# Patient Record
Sex: Female | Born: 1945 | Race: Black or African American | Hispanic: No | Marital: Married | Smoking: Never smoker
Health system: Southern US, Community
[De-identification: ages and names within clinical notes are randomized; demographics above are authoritative.]

## PROBLEM LIST (undated history)

## (undated) DIAGNOSIS — C50919 Malignant neoplasm of unspecified site of unspecified female breast: Secondary | ICD-10-CM

## (undated) DIAGNOSIS — Z9889 Other specified postprocedural states: Secondary | ICD-10-CM

## (undated) DIAGNOSIS — E785 Hyperlipidemia, unspecified: Secondary | ICD-10-CM

## (undated) DIAGNOSIS — R159 Full incontinence of feces: Secondary | ICD-10-CM

## (undated) DIAGNOSIS — I10 Essential (primary) hypertension: Secondary | ICD-10-CM

## (undated) DIAGNOSIS — K5730 Diverticulosis of large intestine without perforation or abscess without bleeding: Secondary | ICD-10-CM

## (undated) HISTORY — PX: PARTIAL HYSTERECTOMY: SHX80

## (undated) HISTORY — DX: Full incontinence of feces: R15.9

## (undated) HISTORY — DX: Other specified postprocedural states: Z98.890

## (undated) HISTORY — PX: OTHER SURGICAL HISTORY: SHX169

## (undated) HISTORY — DX: Hyperlipidemia, unspecified: E78.5

## (undated) HISTORY — DX: Diverticulosis of large intestine without perforation or abscess without bleeding: K57.30

## (undated) HISTORY — DX: Essential (primary) hypertension: I10

## (undated) HISTORY — DX: Malignant neoplasm of unspecified site of unspecified female breast: C50.919

---

## 2001-10-06 ENCOUNTER — Encounter: Payer: Self-pay | Admitting: Specialist

## 2001-10-06 ENCOUNTER — Ambulatory Visit (HOSPITAL_COMMUNITY): Admission: RE | Admit: 2001-10-06 | Discharge: 2001-10-06 | Payer: Self-pay | Admitting: Obstetrics and Gynecology

## 2002-11-09 ENCOUNTER — Encounter: Payer: Self-pay | Admitting: Specialist

## 2002-11-09 ENCOUNTER — Ambulatory Visit (HOSPITAL_COMMUNITY): Admission: RE | Admit: 2002-11-09 | Discharge: 2002-11-09 | Payer: Self-pay | Admitting: Specialist

## 2003-11-29 ENCOUNTER — Ambulatory Visit (HOSPITAL_COMMUNITY): Admission: RE | Admit: 2003-11-29 | Discharge: 2003-11-29 | Payer: Self-pay | Admitting: Specialist

## 2004-05-16 ENCOUNTER — Ambulatory Visit: Payer: Self-pay | Admitting: Internal Medicine

## 2004-05-16 ENCOUNTER — Ambulatory Visit (HOSPITAL_COMMUNITY): Admission: RE | Admit: 2004-05-16 | Discharge: 2004-05-16 | Payer: Self-pay | Admitting: Internal Medicine

## 2004-05-16 HISTORY — PX: COLONOSCOPY: SHX174

## 2004-06-16 DIAGNOSIS — C50919 Malignant neoplasm of unspecified site of unspecified female breast: Secondary | ICD-10-CM

## 2004-06-16 HISTORY — DX: Malignant neoplasm of unspecified site of unspecified female breast: C50.919

## 2005-01-21 ENCOUNTER — Ambulatory Visit (HOSPITAL_COMMUNITY): Admission: RE | Admit: 2005-01-21 | Discharge: 2005-01-21 | Payer: Self-pay | Admitting: Specialist

## 2005-02-12 ENCOUNTER — Encounter (INDEPENDENT_AMBULATORY_CARE_PROVIDER_SITE_OTHER): Payer: Self-pay | Admitting: Diagnostic Radiology

## 2005-02-12 ENCOUNTER — Ambulatory Visit (HOSPITAL_COMMUNITY): Admission: RE | Admit: 2005-02-12 | Discharge: 2005-02-12 | Payer: Self-pay | Admitting: Specialist

## 2005-02-19 ENCOUNTER — Ambulatory Visit (HOSPITAL_COMMUNITY): Admission: RE | Admit: 2005-02-19 | Discharge: 2005-02-19 | Payer: Self-pay | Admitting: Specialist

## 2005-03-10 ENCOUNTER — Encounter: Admission: RE | Admit: 2005-03-10 | Discharge: 2005-03-10 | Payer: Self-pay | Admitting: General Surgery

## 2005-03-10 ENCOUNTER — Ambulatory Visit (HOSPITAL_BASED_OUTPATIENT_CLINIC_OR_DEPARTMENT_OTHER): Admission: RE | Admit: 2005-03-10 | Discharge: 2005-03-10 | Payer: Self-pay | Admitting: General Surgery

## 2005-03-10 ENCOUNTER — Ambulatory Visit (HOSPITAL_COMMUNITY): Admission: RE | Admit: 2005-03-10 | Discharge: 2005-03-10 | Payer: Self-pay | Admitting: General Surgery

## 2005-03-10 ENCOUNTER — Encounter (INDEPENDENT_AMBULATORY_CARE_PROVIDER_SITE_OTHER): Payer: Self-pay | Admitting: *Deleted

## 2005-03-11 ENCOUNTER — Ambulatory Visit: Payer: Self-pay | Admitting: Oncology

## 2005-03-17 ENCOUNTER — Ambulatory Visit: Admission: RE | Admit: 2005-03-17 | Discharge: 2005-04-21 | Payer: Self-pay | Admitting: Radiation Oncology

## 2005-03-19 ENCOUNTER — Ambulatory Visit (HOSPITAL_COMMUNITY): Admission: RE | Admit: 2005-03-19 | Discharge: 2005-03-19 | Payer: Self-pay | Admitting: General Surgery

## 2005-03-19 ENCOUNTER — Ambulatory Visit (HOSPITAL_BASED_OUTPATIENT_CLINIC_OR_DEPARTMENT_OTHER): Admission: RE | Admit: 2005-03-19 | Discharge: 2005-03-19 | Payer: Self-pay | Admitting: General Surgery

## 2005-03-19 ENCOUNTER — Encounter (INDEPENDENT_AMBULATORY_CARE_PROVIDER_SITE_OTHER): Payer: Self-pay | Admitting: Specialist

## 2005-04-09 ENCOUNTER — Encounter (HOSPITAL_COMMUNITY): Admission: RE | Admit: 2005-04-09 | Discharge: 2005-05-09 | Payer: Self-pay | Admitting: Oncology

## 2005-04-09 ENCOUNTER — Ambulatory Visit (HOSPITAL_COMMUNITY): Payer: Self-pay | Admitting: Oncology

## 2005-04-09 ENCOUNTER — Encounter: Admission: RE | Admit: 2005-04-09 | Discharge: 2005-04-09 | Payer: Self-pay | Admitting: Oncology

## 2005-04-14 ENCOUNTER — Ambulatory Visit (HOSPITAL_BASED_OUTPATIENT_CLINIC_OR_DEPARTMENT_OTHER): Admission: RE | Admit: 2005-04-14 | Discharge: 2005-04-14 | Payer: Self-pay | Admitting: General Surgery

## 2005-04-14 ENCOUNTER — Ambulatory Visit (HOSPITAL_COMMUNITY): Admission: RE | Admit: 2005-04-14 | Discharge: 2005-04-14 | Payer: Self-pay | Admitting: General Surgery

## 2005-05-19 ENCOUNTER — Encounter (HOSPITAL_COMMUNITY): Admission: RE | Admit: 2005-05-19 | Discharge: 2005-05-28 | Payer: Self-pay | Admitting: Oncology

## 2005-05-19 ENCOUNTER — Encounter: Admission: RE | Admit: 2005-05-19 | Discharge: 2005-05-28 | Payer: Self-pay | Admitting: Oncology

## 2005-05-28 ENCOUNTER — Ambulatory Visit (HOSPITAL_COMMUNITY): Payer: Self-pay | Admitting: Oncology

## 2005-06-17 ENCOUNTER — Encounter (HOSPITAL_COMMUNITY): Admission: RE | Admit: 2005-06-17 | Discharge: 2005-07-17 | Payer: Self-pay | Admitting: Oncology

## 2005-06-17 ENCOUNTER — Encounter: Admission: RE | Admit: 2005-06-17 | Discharge: 2005-06-17 | Payer: Self-pay | Admitting: Oncology

## 2005-07-24 ENCOUNTER — Ambulatory Visit (HOSPITAL_COMMUNITY): Payer: Self-pay | Admitting: Oncology

## 2005-07-24 ENCOUNTER — Encounter (HOSPITAL_COMMUNITY): Admission: RE | Admit: 2005-07-24 | Discharge: 2005-08-23 | Payer: Self-pay | Admitting: Oncology

## 2005-07-24 ENCOUNTER — Encounter: Admission: RE | Admit: 2005-07-24 | Discharge: 2005-07-24 | Payer: Self-pay | Admitting: Oncology

## 2005-08-25 ENCOUNTER — Encounter: Admission: RE | Admit: 2005-08-25 | Discharge: 2005-08-25 | Payer: Self-pay | Admitting: Oncology

## 2005-09-09 ENCOUNTER — Encounter: Admission: RE | Admit: 2005-09-09 | Discharge: 2005-09-09 | Payer: Self-pay | Admitting: Radiation Oncology

## 2005-09-09 ENCOUNTER — Ambulatory Visit: Admission: RE | Admit: 2005-09-09 | Discharge: 2005-12-08 | Payer: Self-pay | Admitting: Radiation Oncology

## 2005-09-18 ENCOUNTER — Ambulatory Visit (HOSPITAL_COMMUNITY): Payer: Self-pay | Admitting: Oncology

## 2005-10-02 ENCOUNTER — Encounter: Admission: RE | Admit: 2005-10-02 | Discharge: 2005-10-02 | Payer: Self-pay | Admitting: Oncology

## 2005-10-02 ENCOUNTER — Encounter (HOSPITAL_COMMUNITY): Admission: RE | Admit: 2005-10-02 | Discharge: 2005-11-01 | Payer: Self-pay | Admitting: Oncology

## 2005-11-06 ENCOUNTER — Encounter: Admission: RE | Admit: 2005-11-06 | Discharge: 2005-11-06 | Payer: Self-pay | Admitting: Oncology

## 2005-11-06 ENCOUNTER — Encounter (HOSPITAL_COMMUNITY): Admission: RE | Admit: 2005-11-06 | Discharge: 2005-12-06 | Payer: Self-pay | Admitting: Oncology

## 2005-12-25 ENCOUNTER — Ambulatory Visit (HOSPITAL_COMMUNITY): Payer: Self-pay | Admitting: Oncology

## 2006-02-04 ENCOUNTER — Ambulatory Visit (HOSPITAL_COMMUNITY): Admission: RE | Admit: 2006-02-04 | Discharge: 2006-02-04 | Payer: Self-pay | Admitting: Family Medicine

## 2006-02-05 ENCOUNTER — Encounter: Admission: RE | Admit: 2006-02-05 | Discharge: 2006-02-05 | Payer: Self-pay | Admitting: Oncology

## 2006-02-05 ENCOUNTER — Encounter (HOSPITAL_COMMUNITY): Admission: RE | Admit: 2006-02-05 | Discharge: 2006-03-07 | Payer: Self-pay | Admitting: Oncology

## 2006-03-02 ENCOUNTER — Ambulatory Visit (HOSPITAL_COMMUNITY): Payer: Self-pay | Admitting: Oncology

## 2006-03-19 ENCOUNTER — Encounter: Admission: RE | Admit: 2006-03-19 | Discharge: 2006-03-19 | Payer: Self-pay | Admitting: Oncology

## 2006-04-03 ENCOUNTER — Ambulatory Visit (HOSPITAL_BASED_OUTPATIENT_CLINIC_OR_DEPARTMENT_OTHER): Admission: RE | Admit: 2006-04-03 | Discharge: 2006-04-03 | Payer: Self-pay | Admitting: General Surgery

## 2006-07-08 ENCOUNTER — Ambulatory Visit (HOSPITAL_COMMUNITY): Admission: RE | Admit: 2006-07-08 | Discharge: 2006-07-08 | Payer: Self-pay | Admitting: Family Medicine

## 2006-08-28 ENCOUNTER — Ambulatory Visit (HOSPITAL_COMMUNITY): Payer: Self-pay | Admitting: Oncology

## 2006-08-28 ENCOUNTER — Encounter (HOSPITAL_COMMUNITY): Admission: RE | Admit: 2006-08-28 | Discharge: 2006-09-27 | Payer: Self-pay | Admitting: Oncology

## 2007-02-17 ENCOUNTER — Encounter (HOSPITAL_COMMUNITY): Admission: RE | Admit: 2007-02-17 | Discharge: 2007-03-16 | Payer: Self-pay | Admitting: Oncology

## 2007-02-17 ENCOUNTER — Ambulatory Visit (HOSPITAL_COMMUNITY): Payer: Self-pay | Admitting: Oncology

## 2007-09-16 ENCOUNTER — Encounter (HOSPITAL_COMMUNITY): Admission: RE | Admit: 2007-09-16 | Discharge: 2007-10-16 | Payer: Self-pay | Admitting: Oncology

## 2007-09-16 ENCOUNTER — Ambulatory Visit (HOSPITAL_COMMUNITY): Payer: Self-pay | Admitting: Oncology

## 2008-02-28 ENCOUNTER — Encounter (HOSPITAL_COMMUNITY): Admission: RE | Admit: 2008-02-28 | Discharge: 2008-03-29 | Payer: Self-pay | Admitting: Oncology

## 2008-03-23 ENCOUNTER — Ambulatory Visit (HOSPITAL_COMMUNITY): Payer: Self-pay | Admitting: Oncology

## 2008-09-14 ENCOUNTER — Encounter (HOSPITAL_COMMUNITY): Admission: RE | Admit: 2008-09-14 | Discharge: 2008-10-14 | Payer: Self-pay | Admitting: Oncology

## 2008-09-14 ENCOUNTER — Ambulatory Visit (HOSPITAL_COMMUNITY): Payer: Self-pay | Admitting: Oncology

## 2009-03-01 ENCOUNTER — Ambulatory Visit (HOSPITAL_COMMUNITY): Admission: RE | Admit: 2009-03-01 | Discharge: 2009-03-01 | Payer: Self-pay | Admitting: Oncology

## 2009-03-22 ENCOUNTER — Ambulatory Visit (HOSPITAL_COMMUNITY): Payer: Self-pay | Admitting: Internal Medicine

## 2009-04-10 ENCOUNTER — Ambulatory Visit (HOSPITAL_COMMUNITY): Admission: RE | Admit: 2009-04-10 | Discharge: 2009-04-10 | Payer: Self-pay | Admitting: Family Medicine

## 2009-04-25 DIAGNOSIS — Z8719 Personal history of other diseases of the digestive system: Secondary | ICD-10-CM

## 2009-04-25 DIAGNOSIS — C50919 Malignant neoplasm of unspecified site of unspecified female breast: Secondary | ICD-10-CM

## 2009-04-25 DIAGNOSIS — E785 Hyperlipidemia, unspecified: Secondary | ICD-10-CM | POA: Insufficient documentation

## 2009-04-25 DIAGNOSIS — I10 Essential (primary) hypertension: Secondary | ICD-10-CM | POA: Insufficient documentation

## 2009-04-25 DIAGNOSIS — E119 Type 2 diabetes mellitus without complications: Secondary | ICD-10-CM

## 2009-04-25 HISTORY — DX: Malignant neoplasm of unspecified site of unspecified female breast: C50.919

## 2009-04-26 ENCOUNTER — Ambulatory Visit: Payer: Self-pay | Admitting: Internal Medicine

## 2009-04-26 DIAGNOSIS — K921 Melena: Secondary | ICD-10-CM

## 2009-04-27 ENCOUNTER — Encounter: Payer: Self-pay | Admitting: Internal Medicine

## 2009-05-02 ENCOUNTER — Ambulatory Visit (HOSPITAL_COMMUNITY): Admission: RE | Admit: 2009-05-02 | Discharge: 2009-05-02 | Payer: Self-pay | Admitting: Internal Medicine

## 2009-05-02 ENCOUNTER — Ambulatory Visit: Payer: Self-pay | Admitting: Internal Medicine

## 2009-05-02 HISTORY — PX: COLONOSCOPY: SHX174

## 2009-09-21 ENCOUNTER — Ambulatory Visit (HOSPITAL_COMMUNITY): Payer: Self-pay | Admitting: Oncology

## 2009-09-21 ENCOUNTER — Encounter (HOSPITAL_COMMUNITY): Admission: RE | Admit: 2009-09-21 | Discharge: 2009-10-21 | Payer: Self-pay | Admitting: Oncology

## 2010-03-06 ENCOUNTER — Ambulatory Visit (HOSPITAL_COMMUNITY): Admission: RE | Admit: 2010-03-06 | Discharge: 2010-03-06 | Payer: Self-pay | Admitting: Oncology

## 2010-03-27 ENCOUNTER — Ambulatory Visit (HOSPITAL_COMMUNITY): Payer: Self-pay | Admitting: Oncology

## 2010-09-04 LAB — COMPREHENSIVE METABOLIC PANEL
Alkaline Phosphatase: 42 U/L (ref 39–117)
Calcium: 9.7 mg/dL (ref 8.4–10.5)
Chloride: 106 mEq/L (ref 96–112)
Creatinine, Ser: 0.83 mg/dL (ref 0.4–1.2)
GFR calc Af Amer: >60 mL/min (ref >60)
Potassium: 4.4 mEq/L (ref 3.5–5.1)
Sodium: 139 mEq/L (ref 135–145)
Total Bilirubin: 0.6 mg/dL (ref 0.3–1.2)

## 2010-09-04 LAB — DIFFERENTIAL
Lymphs Abs: 1.9 10*3/uL (ref 0.7–4.0)
Monocytes Absolute: 0.3 10*3/uL (ref 0.1–1.0)
Monocytes Relative: 7 % (ref 3–12)
Neutro Abs: 1.7 10*3/uL (ref 1.7–7.7)
Neutrophils Relative %: 43 % (ref 43–77)

## 2010-09-04 LAB — CBC
HCT: 37.4 % (ref 36.0–46.0)
MCHC: 34.6 g/dL (ref 30.0–36.0)
Platelets: 193 10*3/uL (ref 150–400)
RBC: 4.62 MIL/uL (ref 3.87–5.11)

## 2010-09-19 ENCOUNTER — Encounter (HOSPITAL_COMMUNITY): Payer: BC Managed Care – PPO | Attending: Oncology

## 2010-09-19 ENCOUNTER — Other Ambulatory Visit (HOSPITAL_COMMUNITY): Payer: Self-pay | Admitting: Oncology

## 2010-09-19 DIAGNOSIS — Z853 Personal history of malignant neoplasm of breast: Secondary | ICD-10-CM | POA: Insufficient documentation

## 2010-09-19 DIAGNOSIS — Z09 Encounter for follow-up examination after completed treatment for conditions other than malignant neoplasm: Secondary | ICD-10-CM | POA: Insufficient documentation

## 2010-09-19 DIAGNOSIS — C50919 Malignant neoplasm of unspecified site of unspecified female breast: Secondary | ICD-10-CM

## 2010-09-19 LAB — COMPREHENSIVE METABOLIC PANEL
ALT: 19 U/L (ref 0–35)
AST: 20 U/L (ref 0–37)
Alkaline Phosphatase: 48 U/L (ref 39–117)
BUN: 18 mg/dL (ref 6–23)
CO2: 24 mEq/L (ref 19–32)
GFR calc Af Amer: >60 mL/min (ref >60)
GFR calc non Af Amer: >60 mL/min (ref >60)
Glucose, Bld: 84 mg/dL (ref 70–99)
Total Bilirubin: 0.4 mg/dL (ref 0.3–1.2)

## 2010-09-19 LAB — CBC
HCT: 37.8 % (ref 36.0–46.0)
MCV: 80.3 fL (ref 78.0–100.0)
Platelets: 217 10*3/uL (ref 150–400)
RBC: 4.71 MIL/uL (ref 3.87–5.11)
WBC: 3.5 10*3/uL — ABNORMAL LOW (ref 4.0–10.5)

## 2010-09-19 LAB — DIFFERENTIAL
Basophils Absolute: 0.1 10*3/uL (ref 0.0–0.1)
Basophils Relative: 2 % — ABNORMAL HIGH (ref 0–1)
Eosinophils Absolute: 0.1 10*3/uL (ref 0.0–0.7)
Monocytes Absolute: 0.3 10*3/uL (ref 0.1–1.0)

## 2010-09-20 ENCOUNTER — Other Ambulatory Visit (HOSPITAL_COMMUNITY): Payer: Self-pay

## 2010-09-23 ENCOUNTER — Encounter (HOSPITAL_COMMUNITY): Payer: BC Managed Care – PPO | Admitting: Oncology

## 2010-09-25 LAB — COMPREHENSIVE METABOLIC PANEL
AST: 31 U/L (ref 0–37)
CO2: 23 mEq/L (ref 19–32)
Calcium: 9.4 mg/dL (ref 8.4–10.5)
Creatinine, Ser: 1.2 mg/dL (ref 0.4–1.2)
GFR calc Af Amer: 55 mL/min — ABNORMAL LOW (ref >60)
GFR calc non Af Amer: 46 mL/min — ABNORMAL LOW (ref >60)
Total Protein: 6.6 g/dL (ref 6.0–8.3)

## 2010-09-25 LAB — DIFFERENTIAL
Eosinophils Relative: 2 % (ref 0–5)
Lymphocytes Relative: 44 % (ref 12–46)
Lymphs Abs: 1.8 10*3/uL (ref 0.7–4.0)
Monocytes Relative: 7 % (ref 3–12)

## 2010-09-25 LAB — CBC
Platelets: 235 10*3/uL (ref 150–400)
RDW: 13.6 % (ref 11.5–15.5)

## 2010-09-27 ENCOUNTER — Encounter (HOSPITAL_COMMUNITY): Payer: BC Managed Care – PPO | Admitting: Oncology

## 2010-09-27 DIAGNOSIS — C50919 Malignant neoplasm of unspecified site of unspecified female breast: Secondary | ICD-10-CM

## 2010-11-01 NOTE — Op Note (Signed)
NAMESHIMA, COMPERE                   ACCOUNT NO.:  [id number]   MEDICAL RECORD NO.:  [id number]          PATIENT TYPE:  AMB   LOCATION:  DSC                          FACILITY:  [facility]   PHYSICIAN:  Rose Phi. Maple Hudson, M.D.   DATE OF BIRTH:  11/06/1945   DATE OF PROCEDURE:  03/10/2005  DATE OF DISCHARGE:                                 OPERATIVE REPORT   PREOPERATIVE DIAGNOSIS:  Stage I carcinoma of the right breast.   POSTOPERATIVE DIAGNOSIS:  Stage I carcinoma of the right breast.   OPERATION PERFORMED:  1.  Blue dye injection.  2.  Right partial mastectomy with needle localization and specimen      mammogram.  3.  Right sentinel lymph node biopsy.   SURGEON:  Rose Phi. Maple Hudson, M.D.   ANESTHESIA:  General.   DESCRIPTION OF PROCEDURE:  Prior to coming to the operating room a  localizing wire had been placed in the upper inner quadrant of her right  breast.  In addition, 1 mCi of technetium sulfur colloid was injected  intradermally.   After suitable general anesthesia was induced, the patient was placed in the  supine position with the arms extended on the arm board.  5 mL off a mixture  of 2 mL of methylene blue and 3 mL of saline was injected in the subareolar  breast tissue and the breast gently massaged for three minutes.  We then  prepped and draped the right breast and axilla.  A curved incision in the  upper inner quadrant of the right breast was then outlined using the  previously placed wire as a guide.  The incision was made and I exposed the  wire and then did a wide excision of the wire and surrounding tissue.  Specimen mammography confirmed the removal of the lesion and the specimen  was oriented for the pathologist and submitted for evaluation of margins.   While that was being done, a short transverse right axillary incision was  made with dissection through the subcutaneous tissue to the clavipectoral  fascia.  Just deep to the fascia, a blue lymphatic was seen going  into a  blue and hot lymph node with counts in excess of 1500, this was removed as  the sentinel lymph node and submitted to the pathologist.  There were no  other palpable blue or hot nodes.   While the pathologist was evaluating the margins and the sentinel lymph  nodes, both incisions were injected with 0.25% Marcaine and then closed in  two layers with 3-0 Vicryl and subcuticular 4-0 Monocryl and Steri-Strips.   The margins were clean and the sentinel node was negative for metastatic  disease.  Dressings were then applied and the patient was then transported  to the recovery room in satisfactory condition having tolerated the  procedure well.      Rose Phi. Maple Hudson, M.D.  Electronically Signed     PRY/MEDQ  D:  03/10/2005  T:  03/11/2005  Job:  [id number]

## 2010-11-01 NOTE — Op Note (Signed)
NAMEONELL, MCMATH                   ACCOUNT NO.:  [id number]   MEDICAL RECORD NO.:  [id number]          PATIENT TYPE:  REC   LOCATION:  RDNC                         FACILITY:  [facility]   PHYSICIAN:  Rose Phi. Maple Hudson, M.D.   DATE OF BIRTH:  04/24/1946   DATE OF PROCEDURE:  03/19/2005  DATE OF DISCHARGE:                                 OPERATIVE REPORT   PREOPERATIVE DIAGNOSIS:  Carcinoma right breast.   POSTOPERATIVE DIAGNOSIS:  Carcinoma right breast.   OPERATION:  Re-excision of lumpectomy site of the right breast for margins.   SURGEON:  Rose Phi. Maple Hudson, M.D.   ANESTHESIA:  General.   OPERATIVE PROCEDURE:  After suitable general anesthesia was induced.  The  patient was placed in supine position with the arms extended on the arm  board.  A right breast was then prepped and draped in usual fashion.   Using the old incision as a guide the incision was incised and opened and  the seroma cavity evacuated.  The deep margin was the one in concern and we  then excised this area.  Hemostasis obtained with cautery.  Subcuticular  closure for Monocryl and Steri-Strips carried out.  Dressing applied.  The  patient transferred to recovery room in satisfactory condition having  tolerated procedure well.      Rose Phi. Maple Hudson, M.D.  Electronically Signed     PRY/MEDQ  D:  11/03/2005  T:  11/03/2005  Job:  [id number]

## 2010-11-01 NOTE — Op Note (Signed)
[name], Michaela Shaffer                   ACCOUNT NO.:  [id number]   MEDICAL RECORD NO.:  [id number]          PATIENT TYPE:  AMB   LOCATION:  DSC                          FACILITY:  [facility]   PHYSICIAN:  Rose Phi. Maple Hudson, M.D.   DATE OF BIRTH:  1945-06-26   DATE OF PROCEDURE:  04/03/2006  DATE OF DISCHARGE:  04/03/2006                                 OPERATIVE REPORT   PREOPERATIVE DIAGNOSIS:  Stage I carcinoma of the right breast.   POSTOPERATIVE DIAGNOSIS:  Stage I carcinoma of the right breast.   OPERATION:  Removal of Port-A-Cath.   SURGEON:  Rose Phi. Maple Hudson, M.D.   ANESTHESIA:  Local.   OPERATIVE PROCEDURE:  The patient was placed on the operating table and the  left upper chest exposed and prepped and draped.   After obtaining good local anesthesia, a transverse incision was made and  the port exposed.  The catheter was grasped and removed from the subclavian  vein and then, with traction on the catheter, the two sutures holding the  port in place were divided and the port removed.  There was no bleeding.  The incision was then closed with subcuticular 4-0 Monocryl and Steri-Strips  and dressings applied.  The patient was then allowed to go home.      Rose Phi. Maple Hudson, M.D.  Electronically Signed     PRY/MEDQ  D:  04/03/2006  T:  04/05/2006  Job:  [id number]

## 2010-11-01 NOTE — Consult Note (Signed)
Michaela Shaffer, Michaela Shaffer                   ACCOUNT NO.:  [id number]   MEDICAL RECORD NO.:  [id number]           PATIENT TYPE:   LOCATION:                                 FACILITY:   PHYSICIAN:  R. Roetta Sessions, Michaela.D. DATE OF BIRTH:  August 22, 1945   DATE OF CONSULTATION:  DATE OF DISCHARGE:                                   CONSULTATION   PRIMARY CARE PHYSICIAN:  Dr. Corrie Mckusick.   REASON FOR CONSULTATION:  Constipation, need for colorectal cancer  screening.   HISTORY:  [name] is a pleasant 65 year old lady sent over courtesy of  Dr. Corrie Mckusick to further evaluate constipation and consider colorectal  cancer screen.  [name] tells me from time to time, she is constipated and  passes only small balls of stool.  She has a bowel movement generally  every day.  Sometimes she has quite a difficult time passing stool.  She has  never had any blood per rectum or melena, no abdominal pain, no change in  weight.  She started taking some Metamucil recently, which has been  associated with marked improvement in her constipation symptoms.  She has  never had her lower GI tract imaged.  There is no family history of  colorectal neoplasia.  She really is not having any upper GI tract symptoms  such as odynophagia, dysphagia, early satiety, reflux symptoms, nausea or  vomiting.   PAST MEDICAL HISTORY:  Significant for hypertension.   PAST SURGICAL HISTORY:  Partial hysterectomy.   CURRENT MEDICATIONS:  1.  Verapamil 240 mg at bed time.  2.  HCTZ 25 mg tablet, one-half tablet daily.  3.  Lipitor 10 mg daily.  4.  Calcium b.i.d.  5.  ASA 81 mg daily.  6.  Lexapro 10 mg daily.   ALLERGIES:  PENICILLIN.   FAMILY HISTORY:  Mother died with diabetes.  Father died with heart-related  problems.  No history of chronic GI or liver illness.   SOCIAL HISTORY:  The patient is married.  She has two children, a boy and a  girl.  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  She does not  use  tobacco or alcohol products.   REVIEW OF SYSTEMS:  As in history of present illness.   PHYSICAL EXAMINATION:  GENERAL:  A pleasant 65 year old lady, resting  comfortable.  VITAL SIGNS:  Weight 186.5 pounds, height 5 feet 7-1/2 inches.  Temperature  98.  Blood pressure 144/86.  Pulse 72.  SKIN:  Warm and dry.  HEENT:  No scleral icterus.  JVD is not prominent.  CHEST:  Lungs are clear to auscultation.  CARDIAC:  Regular rate and rhythm without murmur, gallop or rub.  ABDOMEN:  Nondistended, positive bowel sounds, soft, nontender, without  appreciable mass or organomegaly.  EXTREMITIES:  No edema.   IMPRESSION:  Michaela Shaffer is a pleasant 65 year old lady with recent  symptoms consistent with constipation.  These symptoms have been combated  very effectively with the institution of daily fibrous supplement in the way  of  Metamucil.  I do not detect any alarm symptoms.  However, she is 65 years  of age and has never had a colonoscopy.  To this end, I have offered Ms.  Michaela Shaffer a colonoscopy largely for screening purposes.  I discussed this  approach with Michaela Shaffer.  The potential risks, benefits and alternatives  have been reviewed and questions answered.  She is agreeable.  Plan to  perform colonoscopy in the very near future at [facility].  Further  recommendations to follow.   I would like to thank Dr. Assunta Found for his kind referral.     R. Michaela   Shaffer/MEDQ  D:  04/15/2004  T:  04/15/2004  Job:  [id number]   cc:   [facility]  Fax: [phone]   Corrie Mckusick, Michaela.D.  Fax: [phone]

## 2010-11-01 NOTE — Op Note (Signed)
[name], Michaela Shaffer                   ACCOUNT NO.:  [id number]   MEDICAL RECORD NO.:  [id number]          PATIENT TYPE:  AMB   LOCATION:  DAY                           FACILITY:  [facility]   PHYSICIAN:  R. Roetta Sessions, M.D. DATE OF BIRTH:  Oct 17, 1945   DATE OF PROCEDURE:  05/16/2004  DATE OF DISCHARGE:                                 OPERATIVE REPORT   PROCEDURE:  Diagnostic colonoscopy.   INDICATIONS FOR PROCEDURE:  The patient is a 64 year old lady with recent  change in bowel habits in the way of constipation. She was started on daily  Metamucil and fiber supplementation. Symptoms have resolved. She has not had  any blood per rectum. She never had a colonoscopy. There was no family  history of colorectal neoplasia. Colonoscopy is now being done. This  approach has been discussed with the patient at length. Potential risks,  benefits, and alternatives have been reviewed and questions answered. She is  agreeable. Please see documentation in the medical record, dictated  consultation, and handwritten update note.   PROCEDURE NOTE:  O2 saturation, blood pressure, pulse, and respirations  monitored throughout the entirety of the procedure. Conscious sedation with  Versed 2 mg IV and Demerol 50 mg IV in divided doses.   INSTRUMENT:  Olympus video chip system.   FINDINGS:  Digital rectal examination revealed no abnormalities.   ENDOSCOPIC FINDINGS:  Prep was good.   Rectum:  Examination of the rectal mucosa including retroflexed view of anal  verge revealed no abnormalities.   Colon:  Colonic mucosa was surveyed from the rectosigmoid junction through  the left, transverse, and right colon to area of the appendiceal orifice,  ileocecal valve, and cecum. These structures were well seen and photographed  for the record. Olympus video scope was slowly withdrawn, and all previously  mentioned mucosal surfaces were again seen. Colonic mucosa appeared normal.  The patient tolerated the  procedure well and was reactive to endoscopy.   IMPRESSION:  1.  Normal rectum.  2.  Normal colon.   RECOMMENDATIONS:  1.  Continue daily Metamucil.  2.  Screening colonoscopy in 10 years.     Otelia Sergeant   RMR/MEDQ  D:  05/16/2004  T:  05/16/2004  Job:  [id number]

## 2010-11-01 NOTE — Op Note (Signed)
[name], Michaela Shaffer                   ACCOUNT NO.:  [id number]   MEDICAL RECORD NO.:  [id number]          PATIENT TYPE:  AMB   LOCATION:  DSC                          FACILITY:  [facility]   PHYSICIAN:  Rose Phi. Maple Hudson, M.D.   DATE OF BIRTH:  09-10-1945   DATE OF PROCEDURE:  04/14/2005  DATE OF DISCHARGE:                                 OPERATIVE REPORT   PREOPERATIVE DIAGNOSIS:  Carcinoma of the right breast.   POSTOPERATIVE DIAGNOSIS:  Carcinoma of the right breast.   OPERATION:  Insertion of Port-A-Cath under fluoroscopic control.   SURGEON:  Rose Phi. Maple Hudson, M.D.   ANESTHESIA:  MAC.   OPERATIVE PROCEDURE:  The patient was placed on the operating table with  arms down by the side and the face turned to the left, then the left upper  chest and neck prepped and draped in the usual fashion.   Under local anesthesia, a left subclavian puncture was carried out without  difficulty and the guidewire inserted.  C-arm fluoroscopy was then used to  confirm the proper position of the guidewire.   Then, under local anesthesia, I made an incision on the anterior chest wall  and developed pocket for the implantable port.  I tunneled between the  subclavian puncture site and the new port pocket and passed the catheter  through that, then connected it to the port and placed it in the pocket.  The catheter was then laid on the chest wall and measured to go to the level  of the 4th interspace at the cavoatrial junction.  The catheter was trimmed  at that level.   Dilator and peel-away sheath were then passed over the wire and the wire was  removed followed by the dilator and then the catheter passed through the  peel-away sheath and then it was removed.   Again, the fluoroscope was then used to confirm that there was no kinking in  the system and that there was no crimping, and that the catheter tip was in  the superior vena cava.   The port was then anchored in the pocket with two 2-0 Prolene  sutures.  The  incisions were closed with 3-0 Vicryl and subcuticular 4-0 Monocryl and  Steri-Strips.   We then accessed the system with a right-angle Huber needle with catheter  attached and fully heparinized it and then left it accessed for chemotherapy  tomorrow.   Dressings were then applied and the patient transferred to the recovery room  in satisfactory condition, having tolerated the procedure well.      Rose Phi. Maple Hudson, M.D.  Electronically Signed     PRY/MEDQ  D:  04/14/2005  T:  04/15/2005  Job:  [id number]

## 2011-01-21 ENCOUNTER — Encounter: Payer: Self-pay | Admitting: Gastroenterology

## 2011-01-22 ENCOUNTER — Ambulatory Visit (INDEPENDENT_AMBULATORY_CARE_PROVIDER_SITE_OTHER): Payer: Medicare Other | Admitting: Gastroenterology

## 2011-01-22 ENCOUNTER — Encounter: Payer: Self-pay | Admitting: Gastroenterology

## 2011-01-22 ENCOUNTER — Other Ambulatory Visit: Payer: Self-pay | Admitting: Gastroenterology

## 2011-01-22 VITALS — BP 140/74 | HR 94 | Temp 97.0°F | Ht 67.0 in | Wt 163.0 lb

## 2011-01-22 DIAGNOSIS — R197 Diarrhea, unspecified: Secondary | ICD-10-CM | POA: Insufficient documentation

## 2011-01-22 NOTE — Progress Notes (Signed)
Referring Provider: Dr. Phillips Odor Primary Care Physician:  Cassell Smiles., MD Primary Gastroenterologist: Dr. Jena Gauss   Chief Complaint  Patient presents with  . change in bowels    HPI:   Michaela Shaffer is a pleasant 65 year old female who presents today at the request of Dr. Phillips Odor, whom she saw July 31st. She was complaining of watery stools, starting last week of July. Vague reports of lower abdominal "feeling", as described by patient, that was relieved after bowel movement. She denies any abdominal pain per se. She was given a week long course of Cipro and Flagyl. She has noticed modest improvement with this.   Prior to this episode, reports 1 BM per day, regular. She has had intermittent fecal seepage for the past few years. She wears pads normally. Denies any blood in stool. No sick contacts, no prior abx. Reports occasional postprandial urgency, loose, but better consistency. Reports stool twice per day now.  She has lost weight intentionally by watching her diet and exercising. Used to weigh 206, now 163. Denies any fever or chills. Good appetite.  Last colonoscopy was in Nov 2010, done for fecal seepage. Findings were of lax sphincter tone, left-sided diverticula. Question of diabetic visceral enteropathy. Recommendations to consider resin binder therapy if continued issues.   Past Medical History  Diagnosis Date  . Breast ca 2006    s/p chemo,rad Dr. Mariel Sleet  . Hyperlipidemia   . Diabetes mellitus   . Fecal incontinence   . Hypertension   . Constipation   . S/P colonoscopy Dec 2005, Nov 2010    2005: normal, 2010: lax sphincter tone, left-sided diverticula     Past Surgical History  Procedure Date  . Partial hysterectomy   . Right lumpectomy   . Portacath placement and removal     Current Outpatient Prescriptions  Medication Sig Dispense Refill  . aspirin 81 MG tablet Take 81 mg by mouth daily.        . Calcium Carbonate-Vitamin D (CALCIUM + D) 600-200 MG-UNIT TABS  Take 600 mg/day by mouth 2 (two) times daily.        . hydrochlorothiazide 25 MG tablet Take 25 mg by mouth daily. 1/2 tablet every other day       . metFORMIN (GLUMETZA) 500 MG (MOD) 24 hr tablet Take 500 mg by mouth 2 (two) times daily with a meal.        . verapamil (CALAN-SR) 240 MG CR tablet Take 240 mg by mouth at bedtime.        . fenofibrate 160 MG tablet Take 160 mg by mouth daily.        Marland Kitchen olmesartan (BENICAR) 20 MG tablet Take 20 mg by mouth daily. 1/2 tablet daily       . simvastatin (ZOCOR) 20 MG tablet Take 20 mg by mouth at bedtime.          Allergies as of 01/22/2011 - Review Complete 01/22/2011  Allergen Reaction Noted  . Penicillins      Family History  Problem Relation Age of Onset  . Colon cancer Neg Hx     History   Social History  . Marital Status: Married    Spouse Name: N/A    Number of Children: N/A  . Years of Education: N/A   Social History Main Topics  . Smoking status: Never Smoker   . Smokeless tobacco: Not on file  . Alcohol Use: No  . Drug Use: Not on file  . Sexually Active: Not on file  Review of Systems: Gen: Denies fever, chills, anorexia. Denies fatigue, weakness, unintentional weight loss.  CV: Denies chest pain, palpitations, syncope, peripheral edema, and claudication. Resp: Denies dyspnea at rest, cough, wheezing, coughing up blood, and pleurisy. GI: Denies vomiting blood, jaundice, +fecal seepage, no fecal incontinence.   Denies dysphagia or odynophagia. Derm: Denies rash, itching, dry skin Psych: Denies depression, anxiety, memory loss, confusion. No homicidal or suicidal ideation.  Heme: Denies bruising, bleeding, and enlarged lymph nodes.  Physical Exam: BP 140/74  Pulse 94  Temp(Src) 97 F (36.1 C) (Temporal)  Ht 5\' 7"  (1.702 m)  Wt 163 lb (73.936 kg)  BMI 25.53 kg/m2 General:   Alert and oriented. No distress noted. Pleasant and cooperative.  Head:  Normocephalic and atraumatic. Eyes:  Conjuctiva clear without  scleral icterus. Mouth:  Oral mucosa pink and moist. Good dentition. No lesions. Neck:  Supple, without mass or thyromegaly. Heart:  S1, S2 present without murmurs, rubs, or gallops. Regular rate and rhythm. Abdomen:  +BS, soft, non-tender and non-distended. No rebound or guarding. No HSM or masses noted. Msk:  Symmetrical without gross deformities. Normal posture. Extremities:  Without edema. Neurologic:  Alert and  oriented x4;  grossly normal neurologically. Skin:  Intact without significant lesions or rashes. Cervical Nodes:  No significant cervical adenopathy. Psych:  Alert and cooperative. Normal mood and affect.

## 2011-01-22 NOTE — Assessment & Plan Note (Signed)
65 year old female with acute onset of loose, watery stools last week of July, improved with Cipro/Flagyl provided by PCP. No abdominal pain currently, now averaging 2 soft/loose stools per day. No fever or chills. No prior abx or sick contacts. Prior to episode was averaging 1 BM per day. Recent colonoscopy (2010) on file; lax sphincter tone, left-sided diverticula. Episode likely r/t self-limiting infectious etiology. Will assess with stool studies for completeness. Add fiber to diet after review of stool studies. Limit dairy products. If loose stools continue, consider further work-up. If improved consistency but continued fecal seepage, consider resin binder agent.  Stool culture, Giardia, Cdiff PCR Fiber Follow-up in 3 mos or sooner if needed Further recommendations to follow after stool studies reviewed Continue probiotic

## 2011-01-22 NOTE — Progress Notes (Signed)
Cc to PCP 

## 2011-01-22 NOTE — Patient Instructions (Signed)
Please complete stool studies and return to our office.  After you complete these, start taking supplemental fiber daily (as in metamucil, benefiber, etc).  Try to limit dairy products   After review of your stool studies, we will contact you with the next plan to help with your bowel habits.

## 2011-01-23 NOTE — Progress Notes (Signed)
agree

## 2011-01-24 LAB — CLOSTRIDIUM DIFFICILE BY PCR: Toxigenic C. Difficile by PCR: NOT DETECTED

## 2011-01-24 LAB — GIARDIA ANTIGEN: Giardia Screen (EIA): NEGATIVE

## 2011-01-27 LAB — STOOL CULTURE

## 2011-01-30 NOTE — Progress Notes (Signed)
Quick Note:  Tried to call pt- not home. Left message with female at home number ______

## 2011-02-04 NOTE — Progress Notes (Signed)
Quick Note:  Excellent. Will see her back as planned. ______

## 2011-02-20 ENCOUNTER — Other Ambulatory Visit (HOSPITAL_COMMUNITY): Payer: Self-pay | Admitting: Oncology

## 2011-02-20 DIAGNOSIS — Z139 Encounter for screening, unspecified: Secondary | ICD-10-CM

## 2011-02-20 DIAGNOSIS — C50919 Malignant neoplasm of unspecified site of unspecified female breast: Secondary | ICD-10-CM

## 2011-03-11 LAB — COMPREHENSIVE METABOLIC PANEL
ALT: 41 — ABNORMAL HIGH
AST: 35
Albumin: 4.4
Alkaline Phosphatase: 40
Potassium: 3.7
Sodium: 134 — ABNORMAL LOW
Total Protein: 7.3

## 2011-03-11 LAB — CBC
Hemoglobin: 12.6
Platelets: 235
RDW: 13.5

## 2011-03-11 LAB — CANCER ANTIGEN 27.29: CA 27.29: 17

## 2011-03-11 LAB — DIFFERENTIAL
Basophils Relative: 0
Eosinophils Absolute: 0
Monocytes Absolute: 0.3
Monocytes Relative: 7

## 2011-03-17 LAB — CBC
Hemoglobin: 12.3
MCHC: 33.4
RDW: 13.7

## 2011-03-17 LAB — DIFFERENTIAL
Eosinophils Absolute: 0.1
Lymphocytes Relative: 40
Lymphs Abs: 2
Monocytes Relative: 6
Neutrophils Relative %: 52

## 2011-03-17 LAB — COMPREHENSIVE METABOLIC PANEL
ALT: 25
Calcium: 9.4
GFR calc Af Amer: >60
Glucose, Bld: 86
Sodium: 136
Total Protein: 6.8

## 2011-03-19 ENCOUNTER — Ambulatory Visit (HOSPITAL_COMMUNITY)
Admission: RE | Admit: 2011-03-19 | Discharge: 2011-03-19 | Disposition: A | Discharge status: Home / Self Care | Payer: Medicare Other | Source: Ambulatory Visit | Attending: Oncology | Admitting: Oncology

## 2011-03-19 DIAGNOSIS — C50919 Malignant neoplasm of unspecified site of unspecified female breast: Secondary | ICD-10-CM

## 2011-03-19 DIAGNOSIS — Z853 Personal history of malignant neoplasm of breast: Secondary | ICD-10-CM | POA: Insufficient documentation

## 2011-03-28 LAB — DIFFERENTIAL
Eosinophils Absolute: 0.1
Eosinophils Relative: 2
Lymphs Abs: 1.6
Monocytes Absolute: 0.3
Monocytes Relative: 7

## 2011-03-28 LAB — CBC
HCT: 37.3
Hemoglobin: 12.4
MCV: 81.5
Platelets: 241
RBC: 4.58
WBC: 5.3

## 2011-04-24 ENCOUNTER — Ambulatory Visit: Payer: Medicare Other | Admitting: Gastroenterology

## 2011-04-29 ENCOUNTER — Encounter: Payer: Self-pay | Admitting: Gastroenterology

## 2011-04-29 ENCOUNTER — Ambulatory Visit (INDEPENDENT_AMBULATORY_CARE_PROVIDER_SITE_OTHER): Payer: Medicare Other | Admitting: Gastroenterology

## 2011-04-29 VITALS — BP 129/76 | HR 83 | Temp 97.6°F | Ht 67.0 in | Wt 168.6 lb

## 2011-04-29 DIAGNOSIS — R197 Diarrhea, unspecified: Secondary | ICD-10-CM

## 2011-04-29 NOTE — Progress Notes (Signed)
Referring Provider: Cassell Smiles., MD Primary Care Physician:  Cassell Smiles., MD Primary Gastroenterologist: Dr. Jena Gauss   Chief Complaint  Patient presents with  . Follow-up    HPI:   Michaela Shaffer returns today in follow-up for diarrhea. She was seen in August for acute episode of diarrhea with negative stool studies. She had been given Cipro and Flagyl by her PCP and noticed improvement at that time. She was started on a probiotic and high fiber diet. She has a history of fecal seepage and a fairly recent colonoscopy from 2010 is on file. Findings were of lax sphincter tone, left-sided diverticula.  Taking Align, incorporating high fiber diet. Diarrhea resolved. BM now every day to every other day. No blood in stool. No abdominal pain. Occasional fecal seepage if stool is soft.   Past Medical History  Diagnosis Date  . Breast ca 2006    s/p chemo,rad Dr. Mariel Sleet  . Hyperlipidemia   . Diabetes mellitus   . Fecal incontinence   . Hypertension   . Constipation   . S/P colonoscopy Dec 2005, Nov 2010    2005: normal, 2010: lax sphincter tone, left-sided diverticula     Past Surgical History  Procedure Date  . Partial hysterectomy   . Right lumpectomy   . Portacath placement and removal     Current Outpatient Prescriptions  Medication Sig Dispense Refill  . aspirin 81 MG tablet Take 81 mg by mouth daily.        . Calcium Carbonate-Vitamin D (CALCIUM + D) 600-200 MG-UNIT TABS Take 600 mg/day by mouth 2 (two) times daily.        . fenofibrate 160 MG tablet Take 160 mg by mouth daily.        . hydrochlorothiazide 25 MG tablet Take 25 mg by mouth daily. 1/2 tablet every other day       . metFORMIN (GLUMETZA) 500 MG (MOD) 24 hr tablet Take 500 mg by mouth 2 (two) times daily with a meal.        . simvastatin (ZOCOR) 20 MG tablet Take 20 mg by mouth at bedtime.        . verapamil (CALAN-SR) 240 MG CR tablet Take 240 mg by mouth at bedtime.        Marland Kitchen olmesartan (BENICAR) 20 MG  tablet Take 20 mg by mouth daily. 1/2 tablet daily         Allergies as of 04/29/2011 - Review Complete 04/29/2011  Allergen Reaction Noted  . Penicillins      Family History  Problem Relation Age of Onset  . Colon cancer Neg Hx     History   Social History  . Marital Status: Married    Spouse Name: N/A    Number of Children: N/A  . Years of Education: N/A   Social History Main Topics  . Smoking status: Never Smoker   . Smokeless tobacco: None  . Alcohol Use: No  . Drug Use: No  . Sexually Active: None   Other Topics Concern  . None   Social History Narrative  . None    Review of Systems: Gen: Denies fever, chills, anorexia. Denies fatigue, weakness, weight loss.  CV: Denies chest pain, palpitations, syncope, peripheral edema, and claudication. Resp: Denies dyspnea at rest, cough, wheezing, coughing up blood, and pleurisy. GI: SEE HPI Derm: Denies rash, itching, dry skin Psych: Denies depression, anxiety, memory loss, confusion. No homicidal or suicidal ideation.  Heme: Denies bruising, bleeding, and enlarged lymph nodes.  Physical Exam: BP 129/76  Pulse 83  Temp(Src) 97.6 F (36.4 C) (Temporal)  Ht 5\' 7"  (1.702 m)  Wt 168 lb 9.6 oz (76.476 kg)  BMI 26.41 kg/m2 General:   Alert and oriented. No distress noted. Pleasant and cooperative.  Head:  Normocephalic and atraumatic. Eyes:  Conjuctiva clear without scleral icterus. Mouth:  Oral mucosa pink and moist. Good dentition. No lesions. Heart:  S1, S2 present without murmurs, rubs, or gallops. Regular rate and rhythm. Abdomen:  +BS, soft, non-tender and non-distended. No rebound or guarding. No HSM or masses noted. Msk:  Symmetrical without gross deformities. Normal posture. Extremities:  Without edema. Neurologic:  Alert and  oriented x4;  grossly normal neurologically. Skin:  Intact without significant lesions or rashes. Psych:  Alert and cooperative. Normal mood and affect.

## 2011-04-29 NOTE — Patient Instructions (Signed)
Continue taking a probiotic and supplementing with daily fiber (metamucil, benefiber, etc).  We will see you back in 1 year or sooner if needed! Please call if any concerns.  Happy Thanksgiving!

## 2011-04-30 NOTE — Assessment & Plan Note (Addendum)
Resolved, now at baseline. Taking Align and utilizing high fiber diet. Intermittent fecal seepage if stool is soft. Consider resin binder agent if necessary; I believe we will be ok to just continue with high fiber diet and probiotic daily.   Return in 1 year or sooner if needed

## 2011-05-01 NOTE — Progress Notes (Signed)
Cc to PCP 

## 2011-05-22 ENCOUNTER — Other Ambulatory Visit (HOSPITAL_COMMUNITY): Payer: Self-pay | Admitting: Physician Assistant

## 2011-05-22 DIAGNOSIS — Z139 Encounter for screening, unspecified: Secondary | ICD-10-CM

## 2011-05-29 ENCOUNTER — Ambulatory Visit (HOSPITAL_COMMUNITY)
Admission: RE | Admit: 2011-05-29 | Discharge: 2011-05-29 | Disposition: A | Discharge status: Home / Self Care | Payer: Medicare Other | Source: Ambulatory Visit | Attending: Physician Assistant | Admitting: Physician Assistant

## 2011-05-29 DIAGNOSIS — Z780 Asymptomatic menopausal state: Secondary | ICD-10-CM | POA: Insufficient documentation

## 2011-05-29 DIAGNOSIS — Z139 Encounter for screening, unspecified: Secondary | ICD-10-CM

## 2011-05-29 DIAGNOSIS — Z13820 Encounter for screening for osteoporosis: Secondary | ICD-10-CM | POA: Insufficient documentation

## 2011-09-05 DIAGNOSIS — IMO0002 Reserved for concepts with insufficient information to code with codable children: Secondary | ICD-10-CM | POA: Diagnosis not present

## 2011-09-05 DIAGNOSIS — M21619 Bunion of unspecified foot: Secondary | ICD-10-CM | POA: Diagnosis not present

## 2011-09-05 DIAGNOSIS — F411 Generalized anxiety disorder: Secondary | ICD-10-CM | POA: Diagnosis not present

## 2011-09-23 DIAGNOSIS — H359 Unspecified retinal disorder: Secondary | ICD-10-CM | POA: Diagnosis not present

## 2011-09-24 ENCOUNTER — Encounter (HOSPITAL_COMMUNITY): Payer: Medicare Other | Attending: Oncology

## 2011-09-24 DIAGNOSIS — C50219 Malignant neoplasm of upper-inner quadrant of unspecified female breast: Secondary | ICD-10-CM | POA: Diagnosis not present

## 2011-09-24 DIAGNOSIS — Z09 Encounter for follow-up examination after completed treatment for conditions other than malignant neoplasm: Secondary | ICD-10-CM | POA: Insufficient documentation

## 2011-09-24 DIAGNOSIS — L989 Disorder of the skin and subcutaneous tissue, unspecified: Secondary | ICD-10-CM | POA: Diagnosis not present

## 2011-09-24 DIAGNOSIS — Z853 Personal history of malignant neoplasm of breast: Secondary | ICD-10-CM | POA: Insufficient documentation

## 2011-09-24 LAB — COMPREHENSIVE METABOLIC PANEL
Alkaline Phosphatase: 52 U/L (ref 39–117)
BUN: 21 mg/dL (ref 6–23)
CO2: 23 mEq/L (ref 19–32)
Calcium: 9.6 mg/dL (ref 8.4–10.5)
GFR calc Af Amer: 71 mL/min — ABNORMAL LOW (ref >90)
GFR calc non Af Amer: 62 mL/min — ABNORMAL LOW (ref >90)
Glucose, Bld: 105 mg/dL — ABNORMAL HIGH (ref 70–99)
Total Protein: 6.9 g/dL (ref 6.0–8.3)

## 2011-09-24 LAB — DIFFERENTIAL
Basophils Absolute: 0 10*3/uL (ref 0.0–0.1)
Eosinophils Absolute: 0.1 10*3/uL (ref 0.0–0.7)
Lymphocytes Relative: 49 % — ABNORMAL HIGH (ref 12–46)
Lymphs Abs: 1.6 10*3/uL (ref 0.7–4.0)
Neutrophils Relative %: 41 % — ABNORMAL LOW (ref 43–77)

## 2011-09-24 LAB — CBC
HCT: 36.7 % (ref 36.0–46.0)
Hemoglobin: 12.1 g/dL (ref 12.0–15.0)
MCH: 26 pg (ref 26.0–34.0)
MCHC: 33 g/dL (ref 30.0–36.0)
MCV: 78.9 fL (ref 78.0–100.0)

## 2011-09-24 NOTE — Progress Notes (Signed)
Labs drawn today for cbc/diff,cmp 

## 2011-09-26 ENCOUNTER — Encounter (HOSPITAL_COMMUNITY): Payer: Self-pay | Admitting: Oncology

## 2011-09-26 ENCOUNTER — Encounter (HOSPITAL_BASED_OUTPATIENT_CLINIC_OR_DEPARTMENT_OTHER): Payer: Medicare Other | Admitting: Oncology

## 2011-09-26 VITALS — BP 137/72 | HR 87 | Temp 98.2°F | Ht 67.0 in | Wt 164.0 lb

## 2011-09-26 DIAGNOSIS — C50219 Malignant neoplasm of upper-inner quadrant of unspecified female breast: Secondary | ICD-10-CM

## 2011-09-26 DIAGNOSIS — L989 Disorder of the skin and subcutaneous tissue, unspecified: Secondary | ICD-10-CM | POA: Diagnosis not present

## 2011-09-26 DIAGNOSIS — C50919 Malignant neoplasm of unspecified site of unspecified female breast: Secondary | ICD-10-CM

## 2011-09-26 NOTE — Patient Instructions (Signed)
[facility]  Discharge Instructions  RECOMMENDATIONS MADE BY THE CONSULTANT AND ANY TEST RESULTS WILL BE SENT TO YOUR REFERRING DOCTOR.   Return to clinic in 1 year to see MD. We will get you a dermatology referral to have the lesion on your forehead removed. Report any issues or concerns to this clinic as needed.   I acknowledge that I have been informed and understand all the instructions given to me and received a copy. I do not have any more questions at this time, but understand that I may call the Specialty Clinic at [facility] at [phone] during business hours should I have any further questions or need assistance in obtaining follow-up care.    __________________________________________  _____________  __________ Signature of Patient or Authorized Representative            Date                   Time    __________________________________________ Nurse's Signature

## 2011-09-26 NOTE — Progress Notes (Signed)
This office note has been dictated.

## 2011-09-26 NOTE — Progress Notes (Signed)
CC:   Michaela Shaffer. Michaela Gambler, MD Billie Lade, Ph.D., M.D. Thomas A. Cornett, M.D. Lionel December, M.D.  DIAGNOSES: 1. 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  Her original biopsy took place on     02/12/2005.  She had her first definitive surgery on 03/10/2005.     This was 1.6 cm grade 3 cancer that had 1 sentinel node that was     negative.  No LVI was seen.  She is status post FEC x4 cycles every     29 days followed by dose dense Taxotere and Navelbine for 4 cycles     every 14 days, finishing therapy on 08/21/2005 then treated with     radiation therapy by Dr. Roselind Messier and she is free of disease. 2. New skin lesion left upper forehead not typical seborrheic     keratosis, it is pigmented and it is approximately 10 mm x 4 mm and     does not itch her but it has only come on in the last 6 months or     so, and we will get a dermatologist to see her for removal. 3. Penicillin induced rash. 4. Colonoscopy in 2011 with recent EGD by Dr. Karilyn Cota and she was found     to have what sounds like some esophagitis and gastritis, with     treated Protonix temporarily.  She is not on that now.  She is on     an over-the-counter preparation.  [name] has stable vital signs and she is not in any pain but she did show me a skin lesion which I saw just from talking to her, so we both were aware of it.  It is slightly pigmented and certainly raised off the level of the skin and it is not a classic seborrheic keratosis in my opinion.  She, I think, needs a consultation and we will set that up. Otherwise her labs have been really quite good from the other day. Glucose was only 105.  Lymph nodes are negative throughout.  Both breasts are negative for any masses.  Scars are healed very well on the right breast and  axilla.  Lungs are clear.  Heart shows no murmur, rub or gallop.  Abdomen is soft, nontender without organomegaly or masses. She has no leg edema or arm edema.  So she has done very well.  We will see her back in a year this time, sooner if need be.    ______________________________ Ladona Horns. Mariel Sleet, MD ESN/MEDQ  D:  09/26/2011  T:  09/26/2011  Job:  [id number]

## 2011-09-30 DIAGNOSIS — M2020 Hallux rigidus, unspecified foot: Secondary | ICD-10-CM | POA: Diagnosis not present

## 2011-09-30 DIAGNOSIS — M2010 Hallux valgus (acquired), unspecified foot: Secondary | ICD-10-CM | POA: Diagnosis not present

## 2011-09-30 DIAGNOSIS — M659 Synovitis and tenosynovitis, unspecified: Secondary | ICD-10-CM | POA: Diagnosis not present

## 2011-10-23 DIAGNOSIS — L680 Hirsutism: Secondary | ICD-10-CM | POA: Diagnosis not present

## 2011-12-25 DIAGNOSIS — E785 Hyperlipidemia, unspecified: Secondary | ICD-10-CM | POA: Diagnosis not present

## 2011-12-25 DIAGNOSIS — IMO0002 Reserved for concepts with insufficient information to code with codable children: Secondary | ICD-10-CM | POA: Diagnosis not present

## 2011-12-25 DIAGNOSIS — E119 Type 2 diabetes mellitus without complications: Secondary | ICD-10-CM | POA: Diagnosis not present

## 2012-02-17 ENCOUNTER — Other Ambulatory Visit (HOSPITAL_COMMUNITY): Payer: Self-pay | Admitting: Oncology

## 2012-02-17 DIAGNOSIS — Z139 Encounter for screening, unspecified: Secondary | ICD-10-CM

## 2012-03-24 ENCOUNTER — Ambulatory Visit (HOSPITAL_COMMUNITY)
Admission: RE | Admit: 2012-03-24 | Discharge: 2012-03-24 | Disposition: A | Discharge status: Home / Self Care | Payer: Medicare Other | Source: Ambulatory Visit | Attending: Oncology | Admitting: Oncology

## 2012-03-24 DIAGNOSIS — Z139 Encounter for screening, unspecified: Secondary | ICD-10-CM

## 2012-03-24 DIAGNOSIS — R928 Other abnormal and inconclusive findings on diagnostic imaging of breast: Secondary | ICD-10-CM | POA: Diagnosis not present

## 2012-03-24 DIAGNOSIS — Z853 Personal history of malignant neoplasm of breast: Secondary | ICD-10-CM | POA: Insufficient documentation

## 2012-03-25 DIAGNOSIS — I10 Essential (primary) hypertension: Secondary | ICD-10-CM | POA: Diagnosis not present

## 2012-03-25 DIAGNOSIS — IMO0002 Reserved for concepts with insufficient information to code with codable children: Secondary | ICD-10-CM | POA: Diagnosis not present

## 2012-03-25 DIAGNOSIS — E119 Type 2 diabetes mellitus without complications: Secondary | ICD-10-CM | POA: Diagnosis not present

## 2012-03-25 DIAGNOSIS — E785 Hyperlipidemia, unspecified: Secondary | ICD-10-CM | POA: Diagnosis not present

## 2012-03-30 DIAGNOSIS — Z23 Encounter for immunization: Secondary | ICD-10-CM | POA: Diagnosis not present

## 2012-04-02 DIAGNOSIS — E119 Type 2 diabetes mellitus without complications: Secondary | ICD-10-CM | POA: Diagnosis not present

## 2012-04-07 ENCOUNTER — Encounter: Payer: Self-pay | Admitting: Internal Medicine

## 2012-06-04 ENCOUNTER — Ambulatory Visit (INDEPENDENT_AMBULATORY_CARE_PROVIDER_SITE_OTHER): Payer: Medicare Other | Admitting: Internal Medicine

## 2012-06-04 ENCOUNTER — Encounter: Payer: Self-pay | Admitting: Internal Medicine

## 2012-06-04 VITALS — BP 133/68 | HR 83 | Temp 98.2°F | Ht 67.0 in | Wt 163.0 lb

## 2012-06-04 DIAGNOSIS — K5790 Diverticulosis of intestine, part unspecified, without perforation or abscess without bleeding: Secondary | ICD-10-CM

## 2012-06-04 DIAGNOSIS — K5730 Diverticulosis of large intestine without perforation or abscess without bleeding: Secondary | ICD-10-CM | POA: Diagnosis not present

## 2012-06-04 DIAGNOSIS — R159 Full incontinence of feces: Secondary | ICD-10-CM | POA: Diagnosis not present

## 2012-06-04 DIAGNOSIS — R197 Diarrhea, unspecified: Secondary | ICD-10-CM | POA: Diagnosis not present

## 2012-06-04 NOTE — Progress Notes (Signed)
Primary Care Physician:  Cassell Smiles., MD Primary Gastroenterologist:  Dr. Jena Gauss  Pre-Procedure History & Physical: HPI:  Michaela Shaffer is a 66 y.o. female here for followup on diarrhea fecal seepage and diverticulosis. Since going on a probiotic supplement and daily fiber  - all the above symptoms have settled down. She has 1-2 bowel movements daily. No diarrhea. No incontinence. She really feels that her bowel issues are no longer a problem. History of lax sphincter tone and left-sided diverticula noted on prior colonoscopy in 2010. No family history of colon cancer or colon polyps. She will be due for routine screening 2020.  Past Medical History  Diagnosis Date  . Breast CA 2006    s/p chemo,rad Dr. Mariel Sleet  . Hyperlipidemia   . Diabetes mellitus   . Fecal incontinence   . Hypertension   . Constipation   . S/P colonoscopy Dec 2005, Nov 2010    2005: normal, 2010: lax sphincter tone, left-sided diverticula   . Diverticula of colon     L side    Past Surgical History  Procedure Date  . Partial hysterectomy   . Right lumpectomy   . Portacath placement and removal   . Colonoscopy 05/16/2004    Dr.Denali Sharma- normal rectum, normal colon.  . Colonoscopy 05/02/2009    Dr.Gisell Buehrle- lax sphincter tone, o/w normal rectum, few scattered L sided diverticula    Prior to Admission medications   Medication Sig Start Date End Date Taking? Authorizing Provider  aspirin 81 MG tablet Take 81 mg by mouth daily.     Yes Historical Provider, MD  Calcium Carbonate-Vitamin D (CALCIUM + D) 600-200 MG-UNIT TABS Take 600 mg/day by mouth 2 (two) times daily.     Yes Historical Provider, MD  fenofibrate 160 MG tablet Take 160 mg by mouth daily.    Yes Historical Provider, MD  hydrochlorothiazide 25 MG tablet Take 25 mg by mouth daily. 1/2 tablet every other day    Yes Historical Provider, MD  metFORMIN (GLUMETZA) 500 MG (MOD) 24 hr tablet Take 500 mg by mouth 2 (two) times daily with a meal.     Yes  Historical Provider, MD  polycarbophil (FIBERCON) 625 MG tablet Take 625 mg by mouth daily.   Yes Historical Provider, MD  simvastatin (ZOCOR) 20 MG tablet Take 40 mg by mouth at bedtime.    Yes Historical Provider, MD  verapamil (CALAN-SR) 240 MG CR tablet Take 240 mg by mouth at bedtime.     Yes Historical Provider, MD  olmesartan (BENICAR) 20 MG tablet Take 20 mg by mouth daily. 1/2 tablet daily     Historical Provider, MD    Allergies as of 06/04/2012 - Review Complete 06/04/2012  Allergen Reaction Noted  . Penicillins      Family History  Problem Relation Age of Onset  . Colon cancer Neg Hx   . Diabetes Mother   . Stroke Father   . Cancer Sister     History   Social History  . Marital Status: Married    Spouse Name: N/A    Number of Children: N/A  . Years of Education: N/A   Occupational History  . Not on file.   Social History Main Topics  . Smoking status: Never Smoker   . Smokeless tobacco: Never Used  . Alcohol Use: No  . Drug Use: No  . Sexually Active: Not on file   Other Topics Concern  . Not on file   Social History Narrative  .  No narrative on file    Review of Systems: See HPI, otherwise negative ROS  Physical Exam: BP 133/68  Pulse 83  Temp 98.2 F (36.8 C) (Oral)  Ht 5\' 7"  (1.702 m)  Wt 163 lb (73.936 kg)  BMI 25.53 kg/m2 General:   Alert,  Well-developed, well-nourished, pleasant and cooperative in NAD Skin:  Intact without significant lesions or rashes. Eyes:  Sclera clear, no icterus.   Conjunctiva pink. Ears:  Normal auditory acuity. Nose:  No deformity, discharge,  or lesions. Mouth:  No deformity or lesions. Neck:  Supple; no masses or thyromegaly. No significant cervical adenopathy. Lungs:  Clear throughout to auscultation.   No wheezes, crackles, or rhonchi. No acute distress. Heart:  Regular rate and rhythm; no murmurs, clicks, rubs,  or gallops. Abdomen: Non-distended, normal bowel sounds.  Soft and nontender without  appreciable mass or hepatosplenomegaly.  Pulses:  Normal pulses noted. Extremities:  Without clubbing or edema.  Impression/Plan:  Bowel function has normalized on daily fiber supplementation. No further fecal incontinence. Patient is pleased with resolution of her symptoms.          She will be due for average risk screening colonoscopy 2020.  Recommendations: Would continue daily fiber supplement indefinitely.  Plan for average risk screening colonoscopy 2020. No scheduled followup appointment here at this time. Of course, should patient have any interim problems she is to call right away.

## 2012-06-04 NOTE — Patient Instructions (Addendum)
Continue daily fiber supplement  Colonoscopy in 2020  Call me if you have any interim problems

## 2012-06-10 DIAGNOSIS — M19019 Primary osteoarthritis, unspecified shoulder: Secondary | ICD-10-CM | POA: Diagnosis not present

## 2012-06-10 DIAGNOSIS — I10 Essential (primary) hypertension: Secondary | ICD-10-CM | POA: Diagnosis not present

## 2012-06-16 HISTORY — PX: BREAST BIOPSY: SHX20

## 2012-09-01 DIAGNOSIS — M25469 Effusion, unspecified knee: Secondary | ICD-10-CM | POA: Diagnosis not present

## 2012-09-01 DIAGNOSIS — M79609 Pain in unspecified limb: Secondary | ICD-10-CM | POA: Diagnosis not present

## 2012-09-01 DIAGNOSIS — Z6825 Body mass index (BMI) 25.0-25.9, adult: Secondary | ICD-10-CM | POA: Diagnosis not present

## 2012-09-01 DIAGNOSIS — M2240 Chondromalacia patellae, unspecified knee: Secondary | ICD-10-CM | POA: Diagnosis not present

## 2012-09-24 ENCOUNTER — Encounter (HOSPITAL_COMMUNITY): Payer: Medicare Other | Attending: Oncology | Admitting: Oncology

## 2012-09-24 ENCOUNTER — Other Ambulatory Visit (HOSPITAL_COMMUNITY): Payer: Self-pay

## 2012-09-24 ENCOUNTER — Encounter (HOSPITAL_COMMUNITY): Payer: Self-pay | Admitting: Oncology

## 2012-09-24 VITALS — BP 107/65 | HR 75 | Temp 97.5°F | Resp 18 | Wt 164.5 lb

## 2012-09-24 DIAGNOSIS — C50919 Malignant neoplasm of unspecified site of unspecified female breast: Secondary | ICD-10-CM

## 2012-09-24 DIAGNOSIS — Z853 Personal history of malignant neoplasm of breast: Secondary | ICD-10-CM | POA: Diagnosis not present

## 2012-09-24 NOTE — Patient Instructions (Addendum)
[facility] Discharge Instructions  RECOMMENDATIONS MADE BY THE CONSULTANT AND ANY TEST RESULTS WILL BE SENT TO YOUR REFERRING PHYSICIAN.  EXAM FINDINGS BY THE PHYSICIAN TODAY AND SIGNS OR SYMPTOMS TO REPORT TO CLINIC OR PRIMARY PHYSICIAN: Exam and discussion by MD.  No evidence of recurrence by exam.  You are doing well.  When you get your blood work done, ask Dr. Phillips Odor to send [place] a copy of the results.  MEDICATIONS PRESCRIBED:  none   INSTRUCTIONS GIVEN AND DISCUSSED: Report any new lumps, bone pain, shortness of breath or other symptoms.  SPECIAL INSTRUCTIONS/FOLLOW-UP: For follow-up in 1 year.  Thank you for choosing [facility] to provide your oncology and hematology care.  To afford each patient quality time with our providers, please arrive at least 15 minutes before your scheduled appointment time.  With your help, our goal is to use those 15 minutes to complete the necessary work-up to ensure our physicians have the information they need to help with your evaluation and healthcare recommendations.    Effective January 1st, 2014, we ask that you re-schedule your appointment with our physicians should you arrive 10 or more minutes late for your appointment.  We strive to give you quality time with our providers, and arriving late affects you and other patients whose appointments are after yours.    Again, thank you for choosing [facility].  Our hope is that these requests will decrease the amount of time that you wait before being seen by our physicians.       _____________________________________________________________  Should you have questions after your visit to [facility], please contact our office at [phone] between the hours of 8:30 a.m. and 5:00 p.m.  Voicemails left after 4:30 p.m. will not be returned until the following business day.  For prescription refill requests, have your pharmacy contact our  office with your prescription refill request.

## 2012-09-24 NOTE — Progress Notes (Signed)
Diagnosis #1 stage I (T1 C. 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 She had her definitive surgery on 03/10/2005. This is a grade 3 cancer 1.6 cm, with one negative sentinel lymph node. No LV I was seen. She took FEC x4 cycles every 21 days followed by dose dense Taxotere navelbine for 4 cycles every 14 days finishing all therapy as of 08/21/2005. She was then treated with radiation therapy by Dr. Roselind Messier and remains free of disease.  #2 penicillin-induced rash #3 benign skin lesion left upper for head status post removal by her dermatologist #4 colonoscopy 2000 level and with also EGD finding esophagitis and gastritis  Her oncology review of systems is negative. She is doing very well. Unfortunately she is still slightly overweight for her height but she is working on thinks. She has a blood work done by her primary care physician. She is asymptomatic  BP 107/65  Pulse 75  Temp(Src) 97.5 F (36.4 C) (Oral)  Resp 18  Wt 164 lb 8 oz (74.617 kg)  BMI 25.76 kg/m2   She is in no acute distress. Lungs are clear. She has no lymphadenopathy in the cervical, supraclavicular, infraclavicular, axillary, or inguinal areas. Her lungs show no rales or rhonchi or rubs. Her heart shows no murmur rub or gallop. Breast exam is negative bilaterally for any masses. The right breast is very well-healed. Abdomen is soft and nontender without hepatosplenomegaly. Bowel sounds normal. She has no peripheral edema.  She looks great we will see her back in one year. She has a routine blood work by her primary care physician

## 2012-10-14 DIAGNOSIS — E785 Hyperlipidemia, unspecified: Secondary | ICD-10-CM | POA: Diagnosis not present

## 2012-10-14 DIAGNOSIS — E119 Type 2 diabetes mellitus without complications: Secondary | ICD-10-CM | POA: Diagnosis not present

## 2012-10-14 DIAGNOSIS — Z6825 Body mass index (BMI) 25.0-25.9, adult: Secondary | ICD-10-CM | POA: Diagnosis not present

## 2012-10-14 DIAGNOSIS — M159 Polyosteoarthritis, unspecified: Secondary | ICD-10-CM | POA: Diagnosis not present

## 2012-10-14 DIAGNOSIS — I10 Essential (primary) hypertension: Secondary | ICD-10-CM | POA: Diagnosis not present

## 2012-10-21 DIAGNOSIS — IMO0002 Reserved for concepts with insufficient information to code with codable children: Secondary | ICD-10-CM | POA: Diagnosis not present

## 2012-10-26 DIAGNOSIS — J00 Acute nasopharyngitis [common cold]: Secondary | ICD-10-CM | POA: Diagnosis not present

## 2012-10-26 DIAGNOSIS — Z6825 Body mass index (BMI) 25.0-25.9, adult: Secondary | ICD-10-CM | POA: Diagnosis not present

## 2012-10-26 DIAGNOSIS — J309 Allergic rhinitis, unspecified: Secondary | ICD-10-CM | POA: Diagnosis not present

## 2012-11-05 DIAGNOSIS — IMO0002 Reserved for concepts with insufficient information to code with codable children: Secondary | ICD-10-CM | POA: Diagnosis not present

## 2012-11-23 DIAGNOSIS — M1710 Unilateral primary osteoarthritis, unspecified knee: Secondary | ICD-10-CM | POA: Diagnosis not present

## 2012-12-10 DIAGNOSIS — Z23 Encounter for immunization: Secondary | ICD-10-CM | POA: Diagnosis not present

## 2012-12-10 DIAGNOSIS — S91309A Unspecified open wound, unspecified foot, initial encounter: Secondary | ICD-10-CM | POA: Diagnosis not present

## 2013-03-09 ENCOUNTER — Other Ambulatory Visit (HOSPITAL_COMMUNITY): Payer: Self-pay | Admitting: Family Medicine

## 2013-03-09 DIAGNOSIS — Z139 Encounter for screening, unspecified: Secondary | ICD-10-CM

## 2013-03-15 DIAGNOSIS — Z23 Encounter for immunization: Secondary | ICD-10-CM | POA: Diagnosis not present

## 2013-03-29 ENCOUNTER — Ambulatory Visit (HOSPITAL_COMMUNITY)
Admission: RE | Admit: 2013-03-29 | Discharge: 2013-03-29 | Disposition: A | Discharge status: Home / Self Care | Payer: Medicare Other | Source: Ambulatory Visit | Attending: Family Medicine | Admitting: Family Medicine

## 2013-03-29 DIAGNOSIS — Z1231 Encounter for screening mammogram for malignant neoplasm of breast: Secondary | ICD-10-CM | POA: Diagnosis not present

## 2013-03-29 DIAGNOSIS — Z139 Encounter for screening, unspecified: Secondary | ICD-10-CM

## 2013-04-04 ENCOUNTER — Other Ambulatory Visit: Payer: Self-pay | Admitting: Family Medicine

## 2013-04-04 DIAGNOSIS — R928 Other abnormal and inconclusive findings on diagnostic imaging of breast: Secondary | ICD-10-CM

## 2013-04-20 ENCOUNTER — Encounter (HOSPITAL_COMMUNITY): Payer: Medicare Other

## 2013-04-21 DIAGNOSIS — E119 Type 2 diabetes mellitus without complications: Secondary | ICD-10-CM | POA: Diagnosis not present

## 2013-04-21 DIAGNOSIS — I10 Essential (primary) hypertension: Secondary | ICD-10-CM | POA: Diagnosis not present

## 2013-04-21 DIAGNOSIS — E785 Hyperlipidemia, unspecified: Secondary | ICD-10-CM | POA: Diagnosis not present

## 2013-04-21 DIAGNOSIS — Z6825 Body mass index (BMI) 25.0-25.9, adult: Secondary | ICD-10-CM | POA: Diagnosis not present

## 2013-04-27 ENCOUNTER — Ambulatory Visit (HOSPITAL_COMMUNITY)
Admission: RE | Admit: 2013-04-27 | Discharge: 2013-04-27 | Disposition: A | Discharge status: Home / Self Care | Payer: Medicare Other | Source: Ambulatory Visit | Attending: Family Medicine | Admitting: Family Medicine

## 2013-04-27 ENCOUNTER — Other Ambulatory Visit: Payer: Self-pay | Admitting: Family Medicine

## 2013-04-27 DIAGNOSIS — R928 Other abnormal and inconclusive findings on diagnostic imaging of breast: Secondary | ICD-10-CM | POA: Insufficient documentation

## 2013-04-27 DIAGNOSIS — Z853 Personal history of malignant neoplasm of breast: Secondary | ICD-10-CM | POA: Insufficient documentation

## 2013-04-27 DIAGNOSIS — R921 Mammographic calcification found on diagnostic imaging of breast: Secondary | ICD-10-CM

## 2013-04-29 ENCOUNTER — Ambulatory Visit
Admission: RE | Admit: 2013-04-29 | Discharge: 2013-04-29 | Disposition: A | Discharge status: Home / Self Care | Payer: Medicare Other | Source: Ambulatory Visit | Attending: Family Medicine | Admitting: Family Medicine

## 2013-04-29 DIAGNOSIS — D249 Benign neoplasm of unspecified breast: Secondary | ICD-10-CM | POA: Diagnosis not present

## 2013-04-29 DIAGNOSIS — R928 Other abnormal and inconclusive findings on diagnostic imaging of breast: Secondary | ICD-10-CM | POA: Diagnosis not present

## 2013-04-29 DIAGNOSIS — R921 Mammographic calcification found on diagnostic imaging of breast: Secondary | ICD-10-CM

## 2013-06-18 DIAGNOSIS — I10 Essential (primary) hypertension: Secondary | ICD-10-CM | POA: Diagnosis not present

## 2013-06-18 DIAGNOSIS — B999 Unspecified infectious disease: Secondary | ICD-10-CM | POA: Diagnosis not present

## 2013-06-18 DIAGNOSIS — J0100 Acute maxillary sinusitis, unspecified: Secondary | ICD-10-CM | POA: Diagnosis not present

## 2013-06-18 DIAGNOSIS — M159 Polyosteoarthritis, unspecified: Secondary | ICD-10-CM | POA: Diagnosis not present

## 2013-06-18 DIAGNOSIS — Z6825 Body mass index (BMI) 25.0-25.9, adult: Secondary | ICD-10-CM | POA: Diagnosis not present

## 2013-07-06 DIAGNOSIS — IMO0002 Reserved for concepts with insufficient information to code with codable children: Secondary | ICD-10-CM | POA: Diagnosis not present

## 2013-07-06 DIAGNOSIS — R799 Abnormal finding of blood chemistry, unspecified: Secondary | ICD-10-CM | POA: Diagnosis not present

## 2013-07-06 DIAGNOSIS — J310 Chronic rhinitis: Secondary | ICD-10-CM | POA: Diagnosis not present

## 2013-09-06 DIAGNOSIS — E1139 Type 2 diabetes mellitus with other diabetic ophthalmic complication: Secondary | ICD-10-CM | POA: Diagnosis not present

## 2013-09-23 ENCOUNTER — Ambulatory Visit (HOSPITAL_COMMUNITY): Payer: Medicare Other

## 2013-09-24 NOTE — Progress Notes (Signed)
This encounter was created in error - please disregard.

## 2013-09-27 ENCOUNTER — Encounter (HOSPITAL_COMMUNITY): Payer: Self-pay

## 2013-09-27 DIAGNOSIS — Z6826 Body mass index (BMI) 26.0-26.9, adult: Secondary | ICD-10-CM | POA: Diagnosis not present

## 2013-09-27 DIAGNOSIS — J019 Acute sinusitis, unspecified: Secondary | ICD-10-CM | POA: Diagnosis not present

## 2013-09-27 DIAGNOSIS — I10 Essential (primary) hypertension: Secondary | ICD-10-CM | POA: Diagnosis not present

## 2013-10-06 ENCOUNTER — Encounter (HOSPITAL_COMMUNITY): Payer: Self-pay | Admitting: Oncology

## 2013-10-06 NOTE — Progress Notes (Signed)
Michaela Shaffer., MD [address] Michaela Shaffer [address]  Infiltrating ductal carcinoma of breast - Plan: CBC with Differential, Comprehensive metabolic panel, Cancer antigen 27.29  CURRENT THERAPY: Surveillance per NCCN guidelines  INTERVAL HISTORY: Michaela Shaffer 68 y.o. female returns for  regular  visit for followup of stage I (T1 C. 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 She had her definitive surgery on 03/10/2005. This is a grade 3 cancer 1.6 cm, with one negative sentinel lymph node. No LV I was seen. She took FEC x4 cycles every 21 days followed by dose dense Taxotere navelbine for 4 cycles every 14 days finishing all therapy as of 08/21/2005. She was then treated with radiation therapy by [name] and remains free of disease.    Infiltrating ductal carcinoma of breast   02/13/2005 Initial Diagnosis Infiltrating ductal carcinoma of breast   03/10/2005 Surgery Right lumpectomy demonstrating a 1.6 cm invasive ductal carcinoma involving the deep margin with 0/1 lymph nodes for disease.  ER 0%, PR 0%, Her2 negative, Ki-67 38%   03/19/2005 Surgery Re-excision of deep margin showing no residula cancer   04/15/2005 - 06/19/2005 Chemotherapy FEC x 4 cycles   07/10/2005 - 08/21/2005 Chemotherapy Navelbine/Taxotere x 4 cycles   09/22/2005 - 11/06/2005 Radiation Therapy [name]     I personally reviewed and went over laboratory results with the patient.  The results are noted within this dictation.   I personally reviewed and went over radiographic studies with the patient.  The results are noted within this dictation.  In November 2014, she underwent a breast biopsy that was negative.  She will be due for her next screening mammogram in November 2015.  I personally reviewed and went over  pathology results with the patient.  She asks if I will exam her back lesions as [name] has been monitoring these in the past.  There is only one on the left lower back that will require follow-up and monitoring.  See physical exam below for details.   She reports that she is due for labs at Dr. Delanna Ahmadi office next week.  Given her recent biopsy and mammogram findings, I want to perform a CA 27.29 to verify that it is WNL.  Therefore, we will perform our labs today and we will fax them to Dr. Delanna Ahmadi office so he does not repeat any labs unnecessarily.   Oncologically, she denies any complaints and ROS questioning is negative.    Past Medical History  Diagnosis Date  . Breast CA 2006    s/p chemo,rad [name]  . Hyperlipidemia   . Diabetes mellitus   . Fecal incontinence   . Hypertension   . Constipation   . S/P colonoscopy Dec 2005, Nov 2010    2005: normal, 2010: lax sphincter tone, left-sided diverticula   . Diverticula of colon     L side  . Infiltrating ductal carcinoma of breast 04/25/2009    Qualifier: History of  By: Zeb Comfort      has Infiltrating ductal carcinoma of breast; DM; HYPERLIPIDEMIA; HYPERTENSION; HEMATOCHEZIA; FECAL INCONTINENCE; CONSTIPATION, HX OF; and Diarrhea on her problem list.     is allergic to penicillins.  [name] had no medications administered during this visit.  Past Surgical History  Procedure Laterality Date  . Partial hysterectomy    .  Right lumpectomy    . Portacath placement and removal    . Colonoscopy  05/16/2004    Dr.Rourk- normal rectum, normal colon.  . Colonoscopy  05/02/2009    Dr.Rourk- lax sphincter tone, o/w normal rectum, few scattered L sided diverticula  . Breast biopsy Right 2014    Denies any headaches, dizziness, double vision, fevers, chills, night sweats, nausea, vomiting, diarrhea, constipation, chest pain, heart palpitations, shortness of breath, blood in stool, black tarry stool, urinary  pain, urinary burning, urinary frequency, hematuria.   PHYSICAL EXAMINATION  ECOG PERFORMANCE STATUS: 0 - Asymptomatic  Filed Vitals:   10/07/13 1000  BP: 101/64  Pulse: 79  Temp: 97.5 F (36.4 C)  Resp: 18    GENERAL:alert, healthy, no distress, well nourished, well developed, comfortable, cooperative and smiling SKIN: skin color, texture, turgor are normal, no rashes or significant lesions.  Left lower back skin lesion that is flat with regular borders.  Slightly more pigmented in the middle of the lesion.  Measuring less than 0.5 cm in size.   HEAD: Normocephalic, No masses, lesions, tenderness or abnormalities EYES: normal, PERRLA, EOMI, Conjunctiva are pink and non-injected EARS: External ears normal OROPHARYNX:mucous membranes are moist  NECK: supple, no adenopathy, thyroid normal size, non-tender, without nodularity, no stridor, non-tender, trachea midline LYMPH:  no palpable lymphadenopathy, no hepatosplenomegaly BREAST:left breast normal without mass, skin or nipple changes or axillary nodes but fibroglandular tissues noted throughout, right post-lumpectomy site well healed and free of suspicious changes LUNGS: clear to auscultation and percussion HEART: regular rate & rhythm, no murmurs, no gallops, S1 normal and S2 normal ABDOMEN:abdomen soft, non-tender, normal bowel sounds, no masses or organomegaly and no hepatosplenomegaly BACK: Back symmetric, no curvature., No CVA tenderness EXTREMITIES:less then 2 second capillary refill, no joint deformities, effusion, or inflammation, no edema, no skin discoloration, no clubbing, no cyanosis  NEURO: alert & oriented x 3 with fluent speech, no focal motor/sensory deficits, gait normal    LABORATORY DATA: CBC    Component Value Date/Time   WBC 3.2* 09/24/2011 1003   RBC 4.65 09/24/2011 1003   HGB 12.1 09/24/2011 1003   HCT 36.7 09/24/2011 1003   PLT 196 09/24/2011 1003   MCV 78.9 09/24/2011 1003   MCH 26.0 09/24/2011 1003   MCHC  33.0 09/24/2011 1003   RDW 13.7 09/24/2011 1003   LYMPHSABS 1.6 09/24/2011 1003   MONOABS 0.2 09/24/2011 1003   EOSABS 0.1 09/24/2011 1003   BASOSABS 0.0 09/24/2011 1003      Chemistry      Component Value Date/Time   NA 138 09/24/2011 1003   K 4.1 09/24/2011 1003   CL 106 09/24/2011 1003   CO2 23 09/24/2011 1003   BUN 21 09/24/2011 1003   CREATININE 0.95 09/24/2011 1003      Component Value Date/Time   CALCIUM 9.6 09/24/2011 1003   ALKPHOS 52 09/24/2011 1003   AST 16 09/24/2011 1003   ALT 17 09/24/2011 1003   BILITOT 0.3 09/24/2011 1003       RADIOGRAPHIC STUDIES:  05/03/2013  Skipper Cliche, MD Tue May 03, 2013 10:27:25 AM EST       ADDENDUM REPORT: 05/03/2013 10:22  ADDENDUM:  Pathology revealed fibroadenomatoid change with calcifications in  the right breast. This was found to be concordant by Dr. Enrique Sack.  Pathology was relayed to the patient by telephone. The patient  reported doing well after the biopsy with moderate amount of  bruising at the site. Post biopsy instructions were reviewed  and her  questions were answered. She was encouraged to call [facility] for any additional concerns. She was  asked to return in 1 year for screening mammography.  Pathology results were reported by Susa Raring RN, BSN on May 03, 2013.  Electronically Signed  By: Skipper Cliche M.D.  On: 05/03/2013 10:22       Study Result    CLINICAL DATA: History of medial breast lumpectomy on the right  with new calcifications in the right upper outer quadrant  EXAM:  STEREOTACTIC VACUUM ASSIST RIGHT  COMPARISON: Previous exams.  FINDINGS:  I met with the patient and we discussed the procedure of  stereotactic-guided biopsy, including benefits and alternatives. We  discussed the high likelihood of a successful procedure. We  discussed the risks of the procedure, including infection, bleeding,  tissue injury, clip migration, and inadequate sampling.  Informed,  written consent was given. The usual time out protocol was performed  immediately prior to the procedure.  Using sterile technique and 2% Lidocaine as local anesthetic, under  stereotactic guidance, a vacuum assisted biopsy device was used to  perform core needle biopsy of calcifications in the upper-outer  quadrant of the right breast using a craniocaudal approach. Specimen  radiograph was performed, showing multiple calcifications.  At the conclusion of the procedure, a dumbbell-shaped tissue marker  clip was deployed into the biopsy cavity. Follow-up 2-view mammogram  confirmed clipin correct position.  IMPRESSION:  Stereotactic-guided biopsy of right breast calcification. No  apparent complications.  Electronically Signed:  By: Skipper Cliche M.D.  On: 04/29/2013 12:08      PATHOLOGY:  04/29/2013  Diagnosis Breast, right, needle core biopsy, UOQ - FIBROADENOMATOID CHANGE WITH CALCIFICATIONS. - NO EVIDENCE OF MALIGNANCY. Microscopic Comment There are foci with fibroadenomatoid change associated with calcifications. No malignancy is identified. Called to [facility] on 05/02/13. (JDP:caf 05/02/13) Claudette Laws MD Pathologist, Electronic Signature (Case signed 05/02/2013)    ASSESSMENT:  1. Stage I (T1 C. 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 She had her definitive surgery on 03/10/2005. This is a grade 3 cancer 1.6 cm, with one negative sentinel lymph node. No LV I was seen. She took FEC x4 cycles every 21 days followed by dose dense Taxotere navelbine for 4 cycles every 14 days finishing all therapy as of 08/21/2005. She was then treated with radiation therapy by [name] and remains free of disease. 2. Left lower back skin lesion measuring 0.5 cm that will  need to be monitored for changes.  Patient Active Problem List   Diagnosis Date Noted  . Diarrhea 01/22/2011  . HEMATOCHEZIA 04/26/2009  . Infiltrating ductal carcinoma of breast 04/25/2009  . DM 04/25/2009  . HYPERLIPIDEMIA 04/25/2009  . HYPERTENSION 04/25/2009  . FECAL INCONTINENCE 04/25/2009  . CONSTIPATION, HX OF 04/25/2009     PLAN:  1. I personally reviewed and went over laboratory results with the patient.  The results are noted within this dictation. 2. I personally reviewed and went over radiographic studies with the patient.  The results are noted within this dictation.   3. I personally reviewed and went over pathology results with the patient. 4. Oncology history imported 5. Labs today: CBC diff, CMET, CA 27.29 6. Next screening mammogram is due in November 2015. 7. Return in 1 year  for follow-up.  I think 2 more years of follow-up and if all is well, we can discuss releasing the patient from the clinic as she will have completed 10 years of oncology surveillance.    THERAPY PLAN:  NCCN guidelines recommends the following surveillance for invasive breast cancer:  A. History and Physical exam every 4-6 months for 5 years and then every 12 months.  B. Mammography every 12 months  C. Women on Tamoxifen: annual gynecologic assessment every 12 months if uterus is present.  D. Women on aromatase inhibitor or who experience ovarian failure secondary to treatment should have monitoring of bone health with a bone mineral density determination at baseline and periodically thereafter.  E. Assess and encourage adherence to adjuvant endocrine therapy.  F. Evidence suggests that active lifestyle and achieving and maintaining an ideal body weight (20-25 BMI) may lead to optimal breast cancer outcomes.  All questions were answered. The patient knows to call the clinic with any problems, questions or concerns. We can certainly see the patient much sooner if necessary.  Patient and plan  discussed with Dr. Farrel Gobble and he is in agreement with the aforementioned.   [facility] 10/07/2013

## 2013-10-07 ENCOUNTER — Encounter (HOSPITAL_COMMUNITY): Payer: Medicare Other | Attending: Oncology | Admitting: Oncology

## 2013-10-07 ENCOUNTER — Encounter (HOSPITAL_COMMUNITY): Payer: Self-pay | Admitting: Oncology

## 2013-10-07 VITALS — BP 101/64 | HR 79 | Temp 97.5°F | Resp 18 | Wt 165.6 lb

## 2013-10-07 DIAGNOSIS — E119 Type 2 diabetes mellitus without complications: Secondary | ICD-10-CM | POA: Insufficient documentation

## 2013-10-07 DIAGNOSIS — Z9221 Personal history of antineoplastic chemotherapy: Secondary | ICD-10-CM | POA: Insufficient documentation

## 2013-10-07 DIAGNOSIS — C50919 Malignant neoplasm of unspecified site of unspecified female breast: Secondary | ICD-10-CM

## 2013-10-07 DIAGNOSIS — R159 Full incontinence of feces: Secondary | ICD-10-CM | POA: Diagnosis not present

## 2013-10-07 DIAGNOSIS — Z923 Personal history of irradiation: Secondary | ICD-10-CM | POA: Diagnosis not present

## 2013-10-07 DIAGNOSIS — I10 Essential (primary) hypertension: Secondary | ICD-10-CM | POA: Insufficient documentation

## 2013-10-07 DIAGNOSIS — Z853 Personal history of malignant neoplasm of breast: Secondary | ICD-10-CM | POA: Diagnosis not present

## 2013-10-07 DIAGNOSIS — Z9010 Acquired absence of unspecified breast and nipple: Secondary | ICD-10-CM | POA: Insufficient documentation

## 2013-10-07 DIAGNOSIS — Z09 Encounter for follow-up examination after completed treatment for conditions other than malignant neoplasm: Secondary | ICD-10-CM | POA: Insufficient documentation

## 2013-10-07 DIAGNOSIS — E785 Hyperlipidemia, unspecified: Secondary | ICD-10-CM | POA: Insufficient documentation

## 2013-10-07 LAB — CBC WITH DIFFERENTIAL/PLATELET
BASOS ABS: 0 10*3/uL (ref 0.0–0.1)
Basophils Relative: 1 % (ref 0–1)
Eosinophils Absolute: 0.1 10*3/uL (ref 0.0–0.7)
Eosinophils Relative: 2 % (ref 0–5)
HCT: 36 % (ref 36.0–46.0)
HEMOGLOBIN: 12.2 g/dL (ref 12.0–15.0)
Lymphocytes Relative: 35 % (ref 12–46)
Lymphs Abs: 2 10*3/uL (ref 0.7–4.0)
MCH: 26.8 pg (ref 26.0–34.0)
MCHC: 33.9 g/dL (ref 30.0–36.0)
MCV: 78.9 fL (ref 78.0–100.0)
MONOS PCT: 7 % (ref 3–12)
Monocytes Absolute: 0.4 10*3/uL (ref 0.1–1.0)
NEUTROS ABS: 3.2 10*3/uL (ref 1.7–7.7)
NEUTROS PCT: 55 % (ref 43–77)
Platelets: 360 10*3/uL (ref 150–400)
RBC: 4.56 MIL/uL (ref 3.87–5.11)
RDW: 13.8 % (ref 11.5–15.5)
WBC: 5.7 10*3/uL (ref 4.0–10.5)

## 2013-10-07 LAB — COMPREHENSIVE METABOLIC PANEL
ALBUMIN: 3.8 g/dL (ref 3.5–5.2)
ALT: 18 U/L (ref 0–35)
AST: 16 U/L (ref 0–37)
Alkaline Phosphatase: 62 U/L (ref 39–117)
BILIRUBIN TOTAL: 0.2 mg/dL — AB (ref 0.3–1.2)
BUN: 22 mg/dL (ref 6–23)
CHLORIDE: 98 meq/L (ref 96–112)
CO2: 23 mEq/L (ref 19–32)
Calcium: 9.9 mg/dL (ref 8.4–10.5)
Creatinine, Ser: 0.88 mg/dL (ref 0.50–1.10)
GFR calc Af Amer: 77 mL/min — ABNORMAL LOW (ref >90)
GFR calc non Af Amer: 66 mL/min — ABNORMAL LOW (ref >90)
Glucose, Bld: 77 mg/dL (ref 70–99)
Potassium: 4.1 mEq/L (ref 3.7–5.3)
Sodium: 135 mEq/L — ABNORMAL LOW (ref 137–147)
Total Protein: 7.7 g/dL (ref 6.0–8.3)

## 2013-10-07 NOTE — Patient Instructions (Signed)
[facility] Discharge Instructions  RECOMMENDATIONS MADE BY THE CONSULTANT AND ANY TEST RESULTS WILL BE SENT TO YOUR REFERRING PHYSICIAN.  EXAM FINDINGS BY THE PHYSICIAN TODAY AND SIGNS OR SYMPTOMS TO REPORT TO CLINIC OR PRIMARY PHYSICIAN: Exam and findings as discussed by Robynn Pane, PA-C.  You are doing well.  No evidence of recurrence by exam.  Will check some labs today and will fax results to Dr. Hilma Favors.  Labs we are drawing are CBC/diff, CMET and CA27-29. Report any new lumps, bone pain, shortness of breath or other symptoms.  MEDICATIONS PRESCRIBED:  none  INSTRUCTIONS/FOLLOW-UP: Mammogram in November and follow-up in 1 year.  Thank you for choosing [facility] to provide your oncology and hematology care.  To afford each patient quality time with our providers, please arrive at least 15 minutes before your scheduled appointment time.  With your help, our goal is to use those 15 minutes to complete the necessary work-up to ensure our physicians have the information they need to help with your evaluation and healthcare recommendations.    Effective January 1st, 2014, we ask that you re-schedule your appointment with our physicians should you arrive 10 or more minutes late for your appointment.  We strive to give you quality time with our providers, and arriving late affects you and other patients whose appointments are after yours.    Again, thank you for choosing [facility].  Our hope is that these requests will decrease the amount of time that you wait before being seen by our physicians.       _____________________________________________________________  Should you have questions after your visit to [facility], please contact our office at [phone] between the hours of 8:30 a.m. and 5:00 p.m.  Voicemails left after 4:30 p.m. will not be returned until the following business day.  For prescription refill requests,  have your pharmacy contact our office with your prescription refill request.

## 2013-10-08 LAB — CANCER ANTIGEN 27.29: CA 27.29: 24 U/mL (ref 0–39)

## 2013-10-11 DIAGNOSIS — E119 Type 2 diabetes mellitus without complications: Secondary | ICD-10-CM | POA: Diagnosis not present

## 2013-10-11 DIAGNOSIS — Z6825 Body mass index (BMI) 25.0-25.9, adult: Secondary | ICD-10-CM | POA: Diagnosis not present

## 2013-10-11 DIAGNOSIS — E785 Hyperlipidemia, unspecified: Secondary | ICD-10-CM | POA: Diagnosis not present

## 2013-10-11 DIAGNOSIS — I10 Essential (primary) hypertension: Secondary | ICD-10-CM | POA: Diagnosis not present

## 2014-02-17 DIAGNOSIS — M23302 Other meniscus derangements, unspecified lateral meniscus, unspecified knee: Secondary | ICD-10-CM | POA: Diagnosis not present

## 2014-03-28 ENCOUNTER — Other Ambulatory Visit (HOSPITAL_COMMUNITY): Payer: Self-pay | Admitting: Family Medicine

## 2014-03-28 DIAGNOSIS — Z1231 Encounter for screening mammogram for malignant neoplasm of breast: Secondary | ICD-10-CM

## 2014-03-31 DIAGNOSIS — Z23 Encounter for immunization: Secondary | ICD-10-CM | POA: Diagnosis not present

## 2014-04-07 ENCOUNTER — Ambulatory Visit (HOSPITAL_COMMUNITY)
Admission: RE | Admit: 2014-04-07 | Discharge: 2014-04-07 | Disposition: A | Discharge status: Home / Self Care | Payer: Medicare Other | Source: Ambulatory Visit | Attending: Family Medicine | Admitting: Family Medicine

## 2014-04-07 ENCOUNTER — Inpatient Hospital Stay (HOSPITAL_COMMUNITY): Admission: RE | Admit: 2014-04-07 | Payer: Medicare Other | Source: Ambulatory Visit

## 2014-04-07 DIAGNOSIS — Z1231 Encounter for screening mammogram for malignant neoplasm of breast: Secondary | ICD-10-CM

## 2014-04-11 DIAGNOSIS — E119 Type 2 diabetes mellitus without complications: Secondary | ICD-10-CM | POA: Diagnosis not present

## 2014-04-11 DIAGNOSIS — Z23 Encounter for immunization: Secondary | ICD-10-CM | POA: Diagnosis not present

## 2014-04-11 DIAGNOSIS — E782 Mixed hyperlipidemia: Secondary | ICD-10-CM | POA: Diagnosis not present

## 2014-04-11 DIAGNOSIS — D0591 Unspecified type of carcinoma in situ of right breast: Secondary | ICD-10-CM | POA: Diagnosis not present

## 2014-04-11 DIAGNOSIS — E663 Overweight: Secondary | ICD-10-CM | POA: Diagnosis not present

## 2014-04-11 DIAGNOSIS — I10 Essential (primary) hypertension: Secondary | ICD-10-CM | POA: Diagnosis not present

## 2014-04-12 ENCOUNTER — Ambulatory Visit (HOSPITAL_COMMUNITY): Payer: Medicare Other

## 2014-08-15 DIAGNOSIS — H3589 Other specified retinal disorders: Secondary | ICD-10-CM | POA: Diagnosis not present

## 2014-08-16 DIAGNOSIS — H1133 Conjunctival hemorrhage, bilateral: Secondary | ICD-10-CM | POA: Diagnosis not present

## 2014-10-05 ENCOUNTER — Encounter (HOSPITAL_COMMUNITY): Payer: Medicare Other | Attending: Oncology | Admitting: Oncology

## 2014-10-05 ENCOUNTER — Encounter (HOSPITAL_COMMUNITY): Payer: Self-pay | Admitting: Oncology

## 2014-10-05 VITALS — BP 119/56 | HR 78 | Temp 98.4°F | Resp 16 | Wt 167.3 lb

## 2014-10-05 DIAGNOSIS — Z171 Estrogen receptor negative status [ER-]: Secondary | ICD-10-CM | POA: Diagnosis not present

## 2014-10-05 DIAGNOSIS — C50911 Malignant neoplasm of unspecified site of right female breast: Secondary | ICD-10-CM

## 2014-10-05 NOTE — Assessment & Plan Note (Addendum)
Stage I (T1 C. 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 She had her definitive surgery on 03/10/2005. This is a grade 3 cancer 1.6 cm, with one negative sentinel lymph node. No LV I was seen. She took FEC x4 cycles every 21 days followed by dose dense Taxotere navelbine for 4 cycles every 14 days finishing all therapy as of 08/21/2005. She was then treated with radiation therapy by [name] and remains free of disease.  We will see her back in 1 year and then discuss releasing the patient from the clinic if all is well as she will be 10 years out from all therapy.

## 2014-10-05 NOTE — Progress Notes (Signed)
[name], MD [place] [address]  Infiltrating ductal carcinoma of breast, right  CURRENT THERAPY: Surveillance per NCCN guidelines  INTERVAL HISTORY: Michaela Shaffer 69 y.o. female returns for followup of stage I (T1 C. 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 She had her definitive surgery on 03/10/2005. This is a grade 3 cancer 1.6 cm, with one negative sentinel lymph node. No LV I was seen. She took FEC x4 cycles every 21 days followed by dose dense Taxotere navelbine for 4 cycles every 14 days finishing all therapy as of 08/21/2005. She was then treated with radiation therapy by [name] and remains free of disease.    Infiltrating ductal carcinoma of breast   02/13/2005 Initial Diagnosis Infiltrating ductal carcinoma of breast   03/10/2005 Surgery Right lumpectomy demonstrating a 1.6 cm invasive ductal carcinoma involving the deep margin with 0/1 lymph nodes for disease.  ER 0%, PR 0%, Her2 negative, Ki-67 38%   03/19/2005 Surgery Re-excision of deep margin showing no residula cancer   04/15/2005 - 06/19/2005 Chemotherapy FEC x 4 cycles   07/10/2005 - 08/21/2005 Chemotherapy Navelbine/Taxotere x 4 cycles   09/22/2005 - 11/06/2005 Radiation Therapy [name]    I personally reviewed and went over laboratory results with the patient.  The results are noted within this dictation.  We will get updated labs from her primary physician's office.  No role for labs today from an oncology standpoint.  I personally reviewed and went over radiographic studies with the patient.  The results are noted within this dictation.  She is up-to-date on mammography.  Chart reviewed.    She denies any complaints and is doing well.    Oncologically, she denies any complaints and ROS  questioning is negative.  Past Medical History  Diagnosis Date  . Breast CA 2006    s/p chemo,rad [name]  . Hyperlipidemia   . Diabetes mellitus   . Fecal incontinence   . Hypertension   . Constipation   . S/P colonoscopy Dec 2005, Nov 2010    2005: normal, 2010: lax sphincter tone, left-sided diverticula   . Diverticula of colon     L side  . Infiltrating ductal carcinoma of breast 04/25/2009    Qualifier: History of  By: Zeb Comfort      has Infiltrating ductal carcinoma of breast; DM; HYPERLIPIDEMIA; HYPERTENSION; HEMATOCHEZIA; FECAL INCONTINENCE; CONSTIPATION, HX OF; and Diarrhea on her problem list.     is allergic to penicillins.  Ms. Morning had no medications administered during this visit.  Past Surgical History  Procedure Laterality Date  . Partial hysterectomy    . Right lumpectomy    . Portacath placement and removal    . Colonoscopy  05/16/2004    Dr.Rourk- normal rectum, normal colon.  . Colonoscopy  05/02/2009    Dr.Rourk- lax sphincter tone, o/w normal rectum, few scattered L sided diverticula  . Breast biopsy Right 2014    Denies any headaches, dizziness, double vision, fevers, chills, night sweats, nausea, vomiting, diarrhea, constipation, chest pain, heart palpitations, shortness of breath, blood in stool, black tarry stool, urinary pain, urinary burning, urinary frequency, hematuria.   PHYSICAL EXAMINATION  ECOG PERFORMANCE STATUS: 0 - Asymptomatic  Filed Vitals:   10/05/14 1200  BP: 119/56  Pulse: 78  Temp: 98.4  F (36.9 C)  Resp: 16    GENERAL:alert, no distress, well nourished, well developed, comfortable, cooperative and smiling SKIN: skin color, texture, turgor are normal, no rashes or significant lesions HEAD: Normocephalic, No masses, lesions, tenderness or abnormalities EYES: normal, PERRLA, EOMI, Conjunctiva are pink and non-injected EARS: External ears normal OROPHARYNX:lips, buccal mucosa, and tongue normal and mucous  membranes are moist  NECK: supple, no adenopathy, thyroid normal size, non-tender, without nodularity, no stridor, non-tender, trachea midline LYMPH:  no palpable lymphadenopathy, no hepatosplenomegaly BREAST:right breast normal without mass, skin or nipple changes or axillary nodes, left post-mastectomy site well healed and free of suspicious changes LUNGS: clear to auscultation and percussion HEART: regular rate & rhythm, no murmurs, no gallops, S1 normal and S2 normal ABDOMEN:abdomen soft, non-tender, normal bowel sounds and no masses or organomegaly BACK: Back symmetric, no curvature., No CVA tenderness EXTREMITIES:less then 2 second capillary refill, no joint deformities, effusion, or inflammation, no edema, no skin discoloration, no clubbing, no cyanosis  NEURO: alert & oriented x 3 with fluent speech, no focal motor/sensory deficits, gait normal   LABORATORY DATA: CBC    Component Value Date/Time   WBC 5.7 10/07/2013 1047   RBC 4.56 10/07/2013 1047   HGB 12.2 10/07/2013 1047   HCT 36.0 10/07/2013 1047   PLT 360 10/07/2013 1047   MCV 78.9 10/07/2013 1047   MCH 26.8 10/07/2013 1047   MCHC 33.9 10/07/2013 1047   RDW 13.8 10/07/2013 1047   LYMPHSABS 2.0 10/07/2013 1047   MONOABS 0.4 10/07/2013 1047   EOSABS 0.1 10/07/2013 1047   BASOSABS 0.0 10/07/2013 1047      Chemistry      Component Value Date/Time   NA 135* 10/07/2013 1047   K 4.1 10/07/2013 1047   CL 98 10/07/2013 1047   CO2 23 10/07/2013 1047   BUN 22 10/07/2013 1047   CREATININE 0.88 10/07/2013 1047      Component Value Date/Time   CALCIUM 9.9 10/07/2013 1047   ALKPHOS 62 10/07/2013 1047   AST 16 10/07/2013 1047   ALT 18 10/07/2013 1047   BILITOT 0.2* 10/07/2013 1047       RADIOGRAPHIC STUDIES:  CLINICAL DATA: Screening.  EXAM: DIGITAL SCREENING BILATERAL MAMMOGRAM WITH CAD  COMPARISON: Previous exam(s).  ACR Breast Density Category b: There are scattered areas of fibroglandular  density.  FINDINGS: There are no findings suspicious for malignancy. Images were processed with CAD.  IMPRESSION: No mammographic evidence of malignancy. A result letter of this screening mammogram will be mailed directly to the patient.  RECOMMENDATION: Screening mammogram in one year. (Code:SM-B-01Y)  BI-RADS CATEGORY 1: Negative.   Electronically Signed  By: Lovey Newcomer M.D.  On: 04/11/2014 13:43    ASSESSMENT AND PLAN:  Infiltrating ductal carcinoma of breast Stage I (T1 C. 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 She had her definitive surgery on 03/10/2005. This is a grade 3 cancer 1.6 cm, with one negative sentinel lymph node. No LV I was seen. She took FEC x4 cycles every 21 days followed by dose dense Taxotere navelbine for 4 cycles every 14 days finishing all therapy as of 08/21/2005. She was then treated with radiation therapy by [name] and remains free of disease.  We will see her back in 1 year and then discuss releasing the patient from the clinic if all is  well as she will be 10 years out from all therapy.       THERAPY PLAN:  NCCN guidelines recommends the following surveillance for invasive breast cancer:  A. History and Physical exam every 4-6 months for 5 years and then every 12 months.  B. Mammography every 12 months  C. Women on Tamoxifen: annual gynecologic assessment every 12 months if uterus is present.  D. Women on aromatase inhibitor or who experience ovarian failure secondary to treatment should have monitoring of bone health with a bone mineral density determination at baseline and periodically thereafter.  E. Assess and encourage adherence to adjuvant endocrine therapy.  F. Evidence suggests that active lifestyle and achieving and maintaining an ideal body  weight (20-25 BMI) may lead to optimal breast cancer outcomes.   All questions were answered. The patient knows to call the clinic with any problems, questions or concerns. We can certainly see the patient much sooner if necessary.  Patient and plan discussed with Dr. Ancil Linsey and she is in agreement with the aforementioned.   This note is electronically signed by: Robynn Pane 10/05/2014 12:24 PM

## 2014-10-05 NOTE — Patient Instructions (Signed)
[place] at [facility] Discharge Instructions  RECOMMENDATIONS MADE BY THE CONSULTANT AND ANY TEST RESULTS WILL BE SENT TO YOUR REFERRING PHYSICIAN.  Exam and discussion by Robynn Pane, PA-C You are doing well. Report any new lumps, bone pain, shortness of breath or other symptoms.  Follow-up in 1 year.  Thank you for choosing [place] at [facility] to provide your oncology and hematology care.  To afford each patient quality time with our provider, please arrive at least 15 minutes before your scheduled appointment time.    You need to re-schedule your appointment should you arrive 10 or more minutes late.  We strive to give you quality time with our providers, and arriving late affects you and other patients whose appointments are after yours.  Also, if you no show three or more times for appointments you may be dismissed from the clinic at the providers discretion.     Again, thank you for choosing [facility].  Our hope is that these requests will decrease the amount of time that you wait before being seen by our physicians.       _____________________________________________________________  Should you have questions after your visit to [facility], please contact our office at [phone] between the hours of 8:30 a.m. and 4:30 p.m.  Voicemails left after 4:30 p.m. will not be returned until the following business day.  For prescription refill requests, have your pharmacy contact our office.

## 2014-10-06 ENCOUNTER — Ambulatory Visit (HOSPITAL_COMMUNITY): Payer: Medicare Other | Admitting: Oncology

## 2015-01-01 IMAGING — MG MM DIGITAL DIAGNOSTIC UNILAT R
3 series · 3 of 3 positions shown · non-contrast
Comparison: With priors

CLINICAL DATA: History of right breast cancer 0449. Right breast
calcifications, screening recall.

EXAM:
DIGITAL DIAGNOSTIC  RIGHT MAMMOGRAM

[R CC]
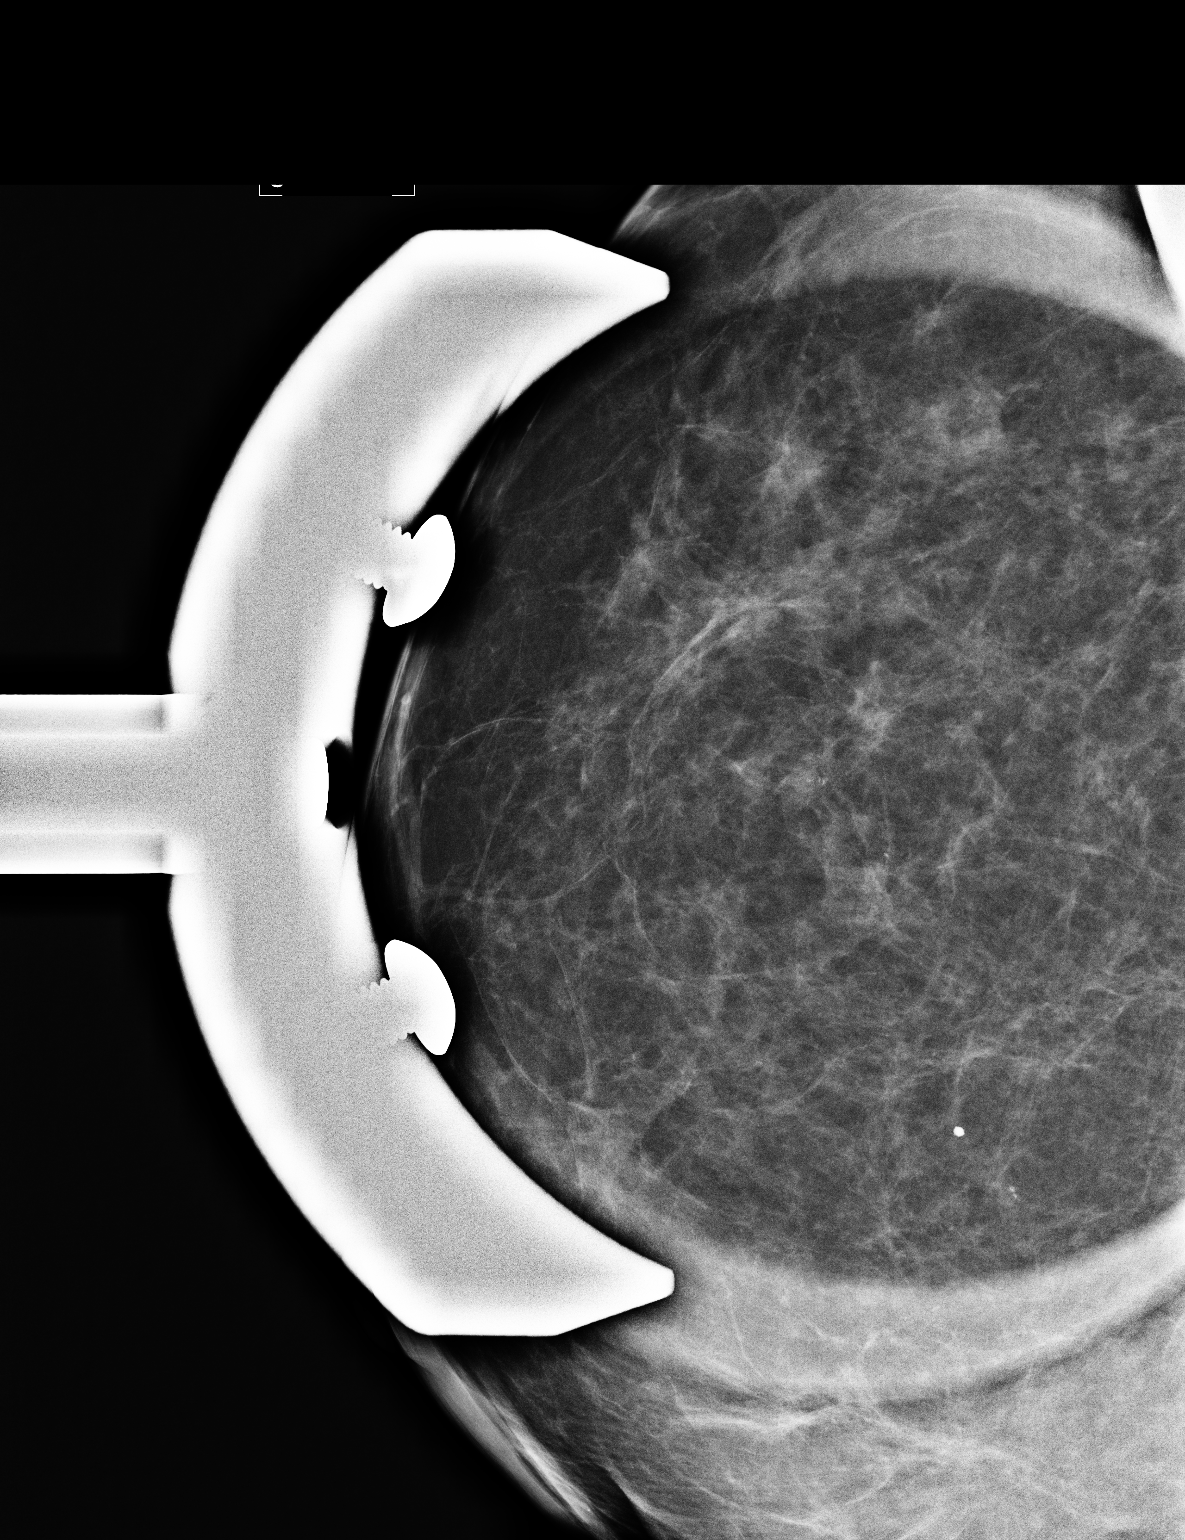

[R ML (1 of 2)]
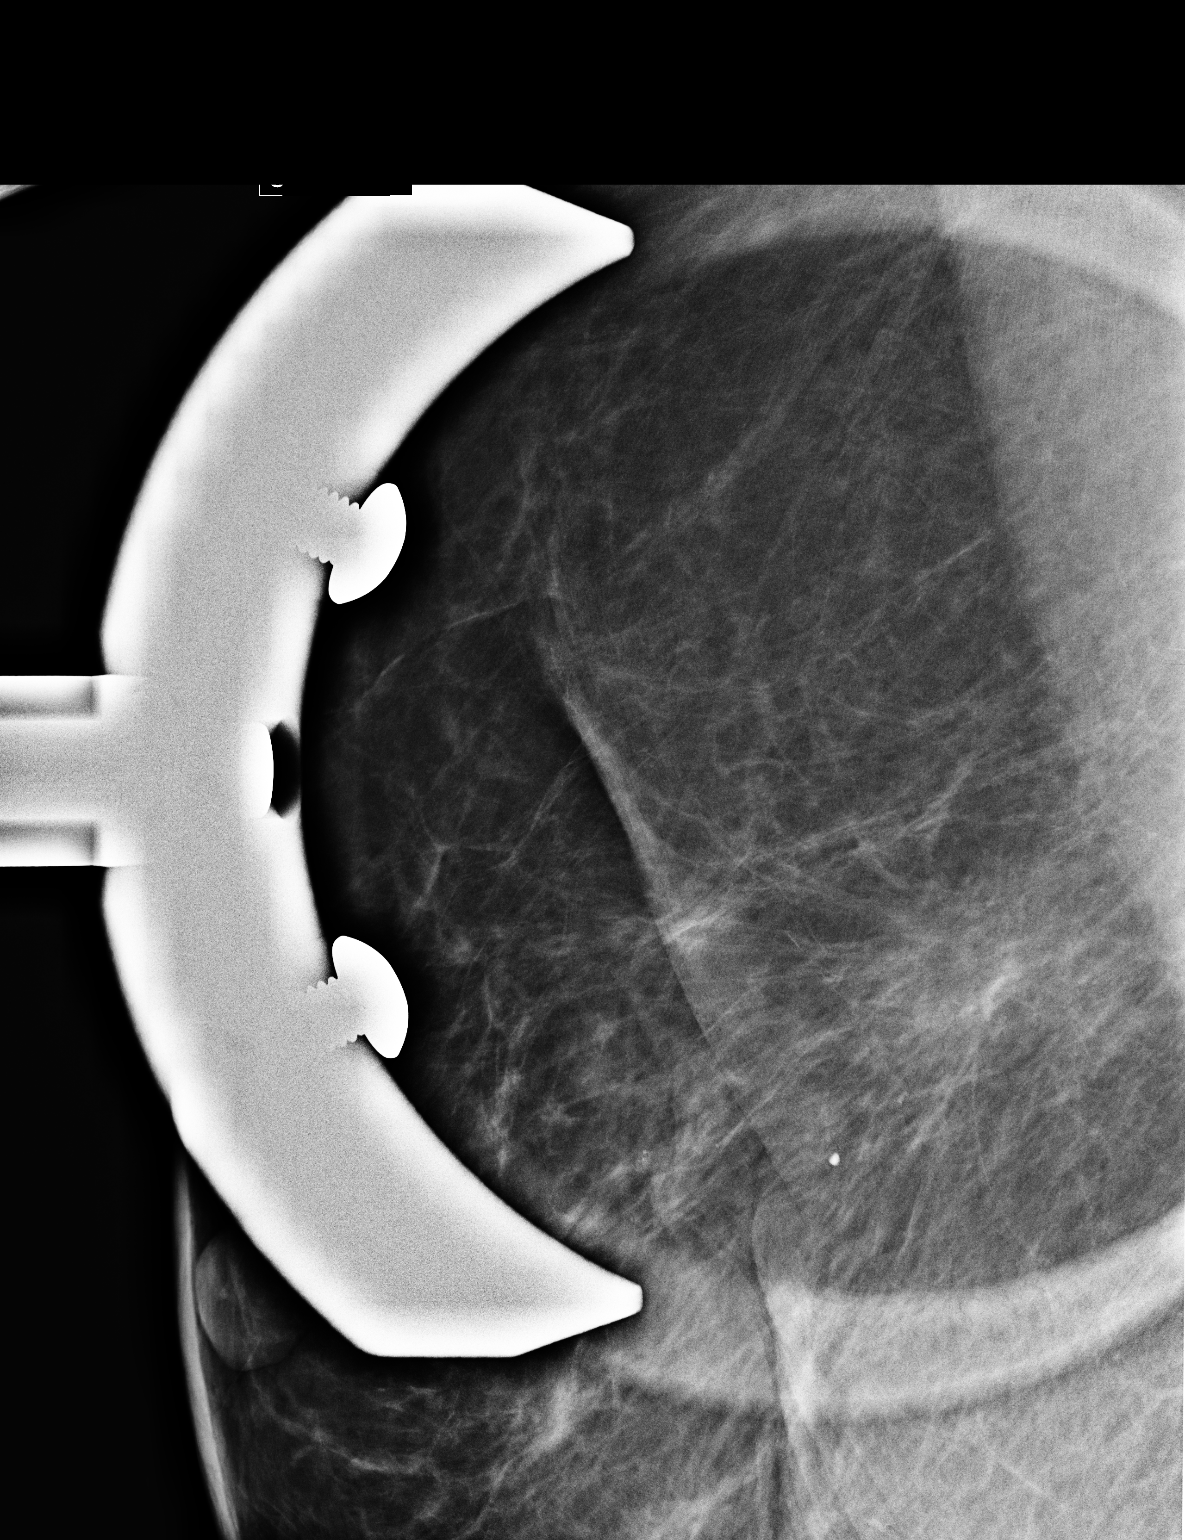

[R ML (2 of 2)]
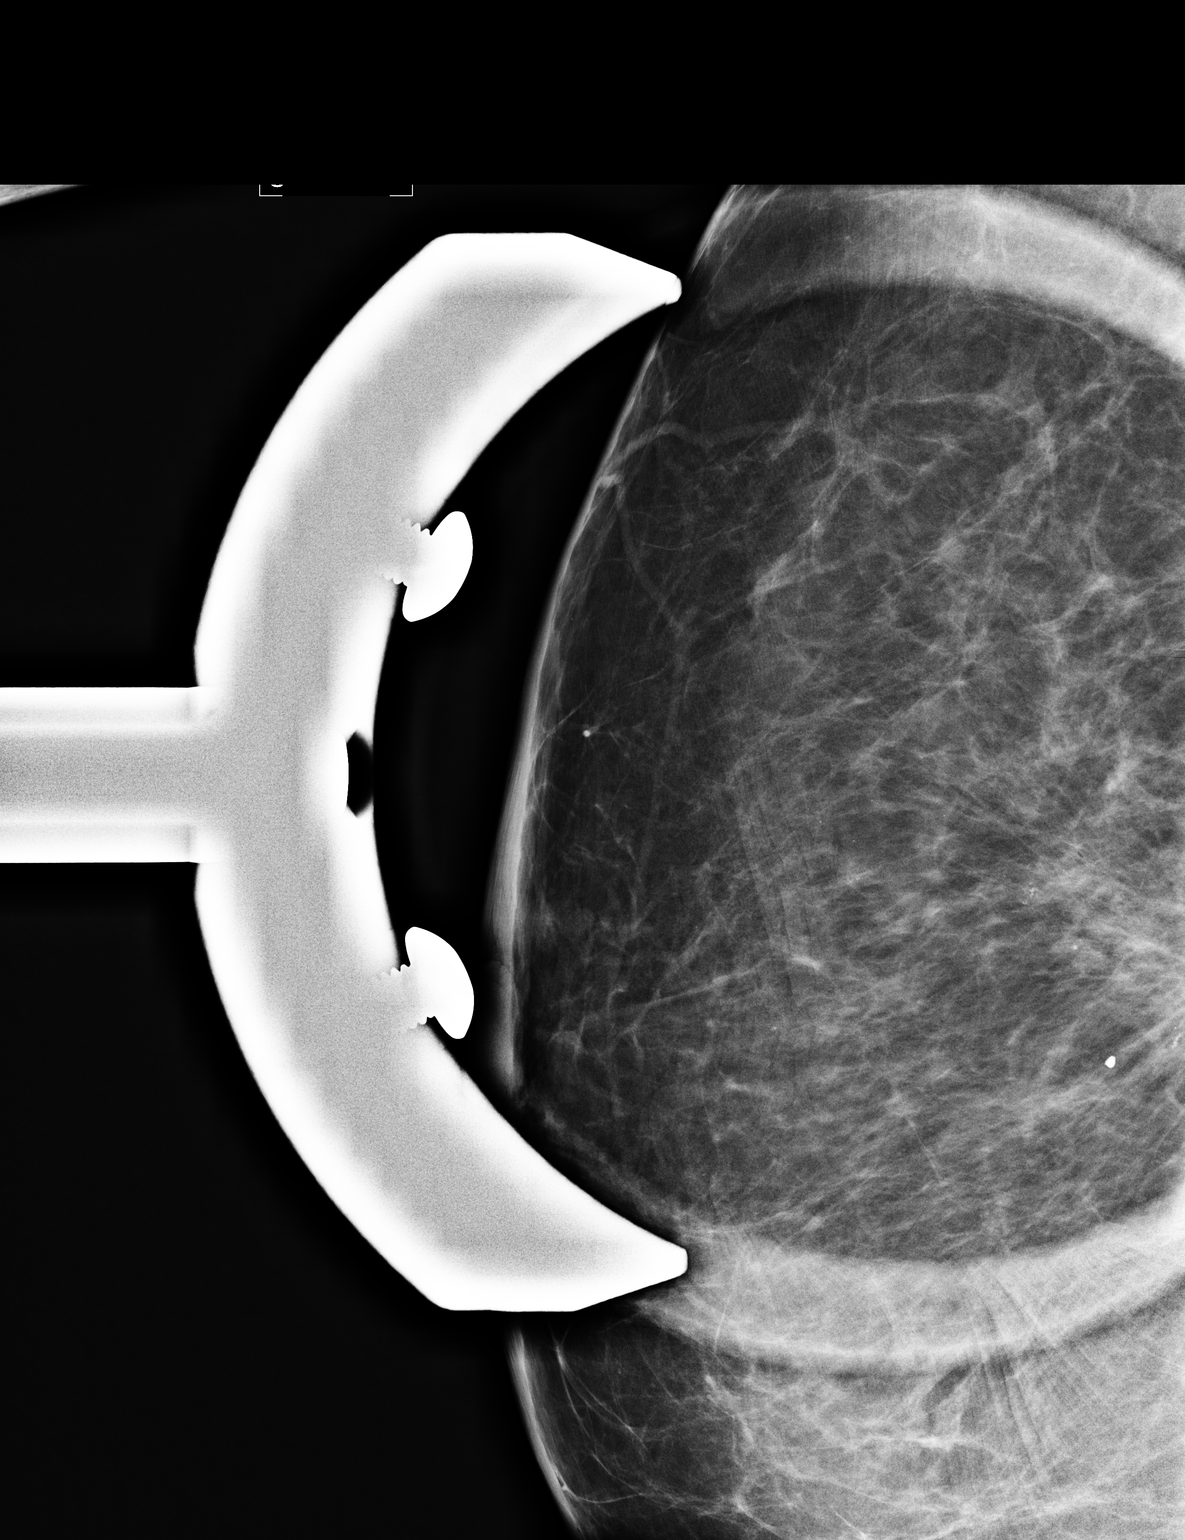

[3 of 3 positions shown; findings below may reference images not displayed]

ACR Breast Density Category b: There are scattered areas of
fibroglandular density.
FINDINGS: Within the right breast upper-outer quadrant middle depth there is a
3 x 3 x 3 mm group of coarse heterogeneous calcifications.
IMPRESSION: Suspicious coarse heterogeneous calcifications right breast,
stereotactic guided biopsy is recommended.

RECOMMENDATION:
Stereotactic guided biopsy right breast calcifications. This has
been scheduled for [REDACTED] [DATE], 8275 at 10 a.m..

I have discussed the findings and recommendations with the patient.
Results were also provided in writing at the conclusion of the
visit. If applicable, a reminder letter will be sent to the patient
regarding the next appointment.

BI-RADS CATEGORY  4: Suspicious abnormality - biopsy should be
considered.

## 2015-01-16 ENCOUNTER — Encounter (HOSPITAL_COMMUNITY): Payer: Self-pay

## 2015-01-16 ENCOUNTER — Emergency Department (HOSPITAL_COMMUNITY): Payer: Medicare Other

## 2015-01-16 ENCOUNTER — Emergency Department (HOSPITAL_COMMUNITY)
Admission: EM | Admit: 2015-01-16 | Discharge: 2015-01-16 | Disposition: A | Discharge status: Home / Self Care | Payer: Medicare Other | Attending: Emergency Medicine | Admitting: Emergency Medicine

## 2015-01-16 DIAGNOSIS — Z880 Allergy status to penicillin: Secondary | ICD-10-CM | POA: Insufficient documentation

## 2015-01-16 DIAGNOSIS — Z79899 Other long term (current) drug therapy: Secondary | ICD-10-CM | POA: Insufficient documentation

## 2015-01-16 DIAGNOSIS — E119 Type 2 diabetes mellitus without complications: Secondary | ICD-10-CM | POA: Diagnosis not present

## 2015-01-16 DIAGNOSIS — Z8719 Personal history of other diseases of the digestive system: Secondary | ICD-10-CM | POA: Diagnosis not present

## 2015-01-16 DIAGNOSIS — R0600 Dyspnea, unspecified: Secondary | ICD-10-CM | POA: Insufficient documentation

## 2015-01-16 DIAGNOSIS — I10 Essential (primary) hypertension: Secondary | ICD-10-CM | POA: Insufficient documentation

## 2015-01-16 DIAGNOSIS — E785 Hyperlipidemia, unspecified: Secondary | ICD-10-CM | POA: Diagnosis not present

## 2015-01-16 DIAGNOSIS — Z853 Personal history of malignant neoplasm of breast: Secondary | ICD-10-CM | POA: Insufficient documentation

## 2015-01-16 DIAGNOSIS — F419 Anxiety disorder, unspecified: Secondary | ICD-10-CM | POA: Diagnosis not present

## 2015-01-16 DIAGNOSIS — Z7982 Long term (current) use of aspirin: Secondary | ICD-10-CM | POA: Insufficient documentation

## 2015-01-16 DIAGNOSIS — E876 Hypokalemia: Secondary | ICD-10-CM | POA: Insufficient documentation

## 2015-01-16 DIAGNOSIS — Z7729 Contact with and (suspected ) exposure to other hazardous substances: Secondary | ICD-10-CM | POA: Insufficient documentation

## 2015-01-16 DIAGNOSIS — R0602 Shortness of breath: Secondary | ICD-10-CM | POA: Diagnosis present

## 2015-01-16 DIAGNOSIS — Z77098 Contact with and (suspected) exposure to other hazardous, chiefly nonmedicinal, chemicals: Secondary | ICD-10-CM

## 2015-01-16 LAB — BASIC METABOLIC PANEL
Anion gap: 14 (ref 5–15)
BUN: 22 mg/dL — ABNORMAL HIGH (ref 6–20)
CHLORIDE: 98 mmol/L — AB (ref 101–111)
CO2: 19 mmol/L — ABNORMAL LOW (ref 22–32)
Calcium: 9.6 mg/dL (ref 8.9–10.3)
Creatinine, Ser: 0.87 mg/dL (ref 0.44–1.00)
GFR calc Af Amer: >60 mL/min (ref >60)
GFR calc non Af Amer: >60 mL/min (ref >60)
GLUCOSE: 88 mg/dL (ref 65–99)
Potassium: 3.1 mmol/L — ABNORMAL LOW (ref 3.5–5.1)
Sodium: 131 mmol/L — ABNORMAL LOW (ref 135–145)

## 2015-01-16 LAB — CBC WITH DIFFERENTIAL/PLATELET
Basophils Absolute: 0 10*3/uL (ref 0.0–0.1)
Basophils Relative: 0 % (ref 0–1)
EOS PCT: 1 % (ref 0–5)
Eosinophils Absolute: 0 10*3/uL (ref 0.0–0.7)
HEMATOCRIT: 35.8 % — AB (ref 36.0–46.0)
HEMOGLOBIN: 12.2 g/dL (ref 12.0–15.0)
Lymphocytes Relative: 40 % (ref 12–46)
Lymphs Abs: 1.6 10*3/uL (ref 0.7–4.0)
MCH: 26.1 pg (ref 26.0–34.0)
MCHC: 34.1 g/dL (ref 30.0–36.0)
MCV: 76.5 fL — AB (ref 78.0–100.0)
Monocytes Absolute: 0.3 10*3/uL (ref 0.1–1.0)
Monocytes Relative: 8 % (ref 3–12)
Neutro Abs: 2.1 10*3/uL (ref 1.7–7.7)
Neutrophils Relative %: 51 % (ref 43–77)
Platelets: 246 10*3/uL (ref 150–400)
RBC: 4.68 MIL/uL (ref 3.87–5.11)
RDW: 13.1 % (ref 11.5–15.5)
WBC: 4.1 10*3/uL (ref 4.0–10.5)

## 2015-01-16 LAB — URINALYSIS, ROUTINE W REFLEX MICROSCOPIC
BILIRUBIN URINE: NEGATIVE
GLUCOSE, UA: NEGATIVE mg/dL
HGB URINE DIPSTICK: NEGATIVE
Ketones, ur: NEGATIVE mg/dL
Nitrite: NEGATIVE
Protein, ur: NEGATIVE mg/dL
Specific Gravity, Urine: <1.005 — ABNORMAL LOW (ref 1.005–1.030)
Urobilinogen, UA: 0.2 mg/dL (ref 0.0–1.0)
pH: 5.5 (ref 5.0–8.0)

## 2015-01-16 LAB — LACTIC ACID, PLASMA
Lactic Acid, Venous: 4.2 mmol/L (ref 0.5–2.0)
Lactic Acid, Venous: 2.4 mmol/L (ref 0.5–2.0)

## 2015-01-16 LAB — BRAIN NATRIURETIC PEPTIDE: B NATRIURETIC PEPTIDE 5: 19 pg/mL (ref 0.0–100.0)

## 2015-01-16 LAB — URINE MICROSCOPIC-ADD ON

## 2015-01-16 LAB — TROPONIN I
Troponin I: <0.03 ng/mL (ref <0.031)
Troponin I: <0.03 ng/mL (ref <0.031)

## 2015-01-16 MED ORDER — SODIUM CHLORIDE 0.9 % IV BOLUS (SEPSIS)
500.0000 mL | Freq: Once | INTRAVENOUS | Status: AC
  Administered 2015-01-16: 500 mL via INTRAVENOUS

## 2015-01-16 MED ORDER — IOHEXOL 350 MG/ML SOLN
100.0000 mL | Freq: Once | INTRAVENOUS | Status: AC | PRN
  Administered 2015-01-16: 100 mL via INTRAVENOUS

## 2015-01-16 MED ORDER — POTASSIUM CHLORIDE CRYS ER 20 MEQ PO TBCR
40.0000 meq | EXTENDED_RELEASE_TABLET | Freq: Once | ORAL | Status: AC
  Administered 2015-01-16: 40 meq via ORAL
  Filled 2015-01-16: qty 2

## 2015-01-16 MED ORDER — SODIUM CHLORIDE 0.9 % IV SOLN
INTRAVENOUS | Status: DC
  Administered 2015-01-16: 18:00:00 via INTRAVENOUS

## 2015-01-16 NOTE — ED Notes (Signed)
CRITICAL VALUE ALERT  Critical value received:  Lactic Acid 2.4 Date of notification: 01/16/2015 Time of notification:  2040 Critical value read back:Yes.    Nurse who received alert:  LRT  MD notified (1st page):  LRT Time of first page:  2040  MD notified (2nd page):  Time of second page:  Responding MD:  Thurnell Garbe Time MD responded:  2042

## 2015-01-16 NOTE — ED Notes (Signed)
Pt tolerated ambulation well with o2 sats at 98% and 88p

## 2015-01-16 NOTE — ED Notes (Signed)
Assumed care of patient from [place], [place]. Pt resting quietly at this time. No distress. VSS. Pt to CT.

## 2015-01-16 NOTE — ED Notes (Signed)
CRITICAL VALUE ALERT  Critical value received:  Lactic acid 4.2  Date of notification:  01/16/2015  Time of notification: 2957  Critical value read back:Yes.    Nurse who received alert:  Shilee Biggs  MD notified (1st page):  mcmanus     Time MD responded:  1800

## 2015-01-16 NOTE — ED Notes (Signed)
Patient transported to CT 

## 2015-01-16 NOTE — Discharge Instructions (Signed)
°Emergency Department Resource Guide °1) Find a Doctor and Pay Out of Pocket °Although you won't have to find out who is covered by your insurance plan, it is a good idea to ask around and get recommendations. You will then need to call the office and see if the doctor you have chosen will accept you as a new patient and what types of options they offer for patients who are self-pay. Some doctors offer discounts or will set up payment plans for their patients who do not have insurance, but you will need to ask so you aren't surprised when you get to your appointment. ° °2) Contact Your Local Health Department °Not all health departments have doctors that can see patients for sick visits, but many do, so it is worth a call to see if yours does. If you don't know where your local health department is, you can check in your phone book. The CDC also has a tool to help you locate your state's health department, and many state websites also have listings of all of their local health departments. ° °3) Find a Walk-in Clinic °If your illness is not likely to be very severe or complicated, you may want to try a walk in clinic. These are popping up all over the country in pharmacies, drugstores, and shopping centers. They're usually staffed by nurse practitioners or physician assistants that have been trained to treat common illnesses and complaints. They're usually fairly quick and inexpensive. However, if you have serious medical issues or chronic medical problems, these are probably not your best option. ° °No Primary Care Doctor: °- Call [facility] Connect at  832-8000 - they can help you locate a primary care doctor that  accepts your insurance, provides certain services, etc. °- Physician Referral Service- 1-800-533-3463 ° °Chronic Pain Problems: °Organization         Address  Phone   Notes  °[facility]  (336) 297-2271 Patients need to be referred by their primary care doctor.  ° °Medication  Assistance: °Organization         Address  Phone   Notes  °[place] [facility] [address] °[place], [place] [address] (336) 641-8030 --Must be a resident of [place] °-- Must have NO insurance coverage whatsoever (no Medicaid/ Medicare, etc.) °-- The pt. MUST have a primary care doctor that directs their care regularly and follows them in the community °  °[facility]  (866) 331-1348   °[employer]  (888) 892-1162   ° °Agencies that provide inexpensive medical care: °Organization         Address  Phone   Notes  °[facility]  (336) 832-8035   °[facility]    (336) 832-7272   °[facility] [address] °[place], [place] [address] (336) 832-4777   °[facility] [address], °[place] (336) 271-4999   °[facility]    (336) 373-0678   °[facility]    (336) 272-1050   °[facility] ° [address], [place] Phone:  (336) 832-4444, Fax:  (336) 832-4440 Hours of Operation:  9 am - 6 pm, M-F.  Also accepts Medicaid/Medicare and self-pay.  °[facility] ° [address], [place] Phone: (336) 832-3150, Fax: (336) 832-3151. Hours of Operation:  8:30 am - 5:30 pm, M-F.  Also accepts Medicaid and self-pay.  °[facility] [place] [address], [place] Phone: (336) 878-6027   °[facility] [address], [place], [place] (336)723-1848, Ext. 123 Mondays & Thursdays: 7-9 AM.  First 15 patients are seen on a first come, first serve basis. °  ° °Medicaid-accepting [place] Providers: ° °Organization         Address  Phone   Notes  °[facility] [address], [place] (336) 641-2100 Also accepts self-pay patients.  °[facility] [address], [place] ° (336) 856-9996   °[facility] [address], [place]  (336) 288-8857   °[facility] [address] [place] [address], [place] (336) 299-7000   °Veita Bland [address], [place]  ° (336) 373-1557 Only accepts [place] Access Medicaid patients after they have their name applied to their card.  ° °Self-Pay (no insurance) in [place]: ° °Organization         Address  Phone   Notes  °Sickle Cell Patients, [facility] [address], [place] (336) 832-1970   °[facility] [address], [place] (336) 832-4400   ° [facility] ° [address], [place] (336) 992-4800   °[facility]/Dr. Osei-Bonsu ° [address] [place] [address], [place] or [address], [place] (336) 841-8500 Phone number for both [place] and [place] locations is the same.  °[facility] [address], [place] (336) 299-0000   °[facility] [address] [place] [address], [place] or [address] (336) 852-7530 °(336) 878-2260   °[facility] [address], [place] (336) 350-1642, phone; (336) 294-5005, fax Sees patients 1st and 3rd Saturday of every month.  Must not qualify for public or private insurance (i.e. Medicaid, Medicare, [place] Health Choice, Veterans' Benefits) • Household income should be no more than 200% of the poverty level •The clinic cannot treat you if you are pregnant or think you are pregnant • Sexually transmitted diseases are not treated at the clinic.  ° ° °Dental Care: °Organization         Address  Phone  Notes  °[place] [facility] [address], [place] (336) 641-6152 Accepts children up to age 21 who are enrolled in Medicaid or [place] Health Choice; pregnant women with a Medicaid card; and children who have applied for Medicaid or [place] Health Choice, but were declined, whose parents can pay a reduced fee at time of service.  °[place]  [facility] [place]  [address], [place] (336) 641-7733 Accepts children up to age 21 who are enrolled in Medicaid or [place] Health Choice; pregnant women with a Medicaid card; and children who have applied for Medicaid or [place] Health Choice, but were declined, whose parents can pay a reduced fee at time of service.  °[facility] ° [address], [place] (336) 641-4533 Patients are seen by appointment only. Walk-ins are not accepted. [facility] will see patients 18 years of age and older. °Monday - Tuesday (8am-5pm) °Most Wednesdays (8:30-5pm) °$30 per visit, cash only  °[facility] ° [address], [place] (336) 641-4533 Patients are seen by appointment only. Walk-ins are not accepted. [facility] will see patients 18 years of age and older. °One   Wednesday Evening (Monthly: Volunteer Based).  $30 per visit, cash only  °[facility]  (919) 537-3737 for adults; Children under age 4, call Graduate Pediatric Dentistry at ([phone]) [phone]. Children aged 4-14, please call (919) [phone] to request a pediatric application. ° Dental services are provided in all areas of dental care including fillings, crowns and bridges, complete and partial dentures, implants, gum treatment, root canals, and extractions. Preventive care is also provided. Treatment is provided to both adults and children. °Patients are selected via a lottery and there is often a waiting list. °  °[facility] [address], °[place] ° (336) 763-8833 [email or web address] °  °[facility] [address], [place], [place] (336)723-1848, Ext. 123 Second and Fourth Thursday of each month, opens at 6:30 AM; Clinic ends at 9 AM.  Patients are seen on a first-come first-served basis, and a limited number are seen during each clinic.  ° °[facility] ° [address], [place], [place] (336) 723-7904    Eligibility Requirements °You must have lived in [place], [place], or [place] counties for at least the last three months. °  You cannot be eligible for state or federal sponsored healthcare insurance, including Veterans Administration, Medicaid, or Medicare. °  You generally cannot be eligible for healthcare insurance through your employer.  °  How to apply: °Eligibility screenings are held every Tuesday and Wednesday afternoon from 1:00 pm until 4:00 pm. You do not need an appointment for the interview!  °[facility] [address], [place], [place] 336-631-2330   °[facility] [facility]  336-342-8273   °[facility]  336-703-3100   °[place]  336-570-6415   ° °Behavioral Health Resources in the Community: °Intensive Outpatient Programs °Organization         Address  Phone  Notes  °[facility] [address], [place], [place] 336-878-6098   °[facility] [address], [place], [place] 336-832-9800   °[facility] [address], [place], [place] ° 336-882-2125   °[facility] [address],  °[place], [place] 1-800-853-5163 or 336-641-4981   °Substance Abuse Resources °Organization         Address  Phone  Notes  °[facility]  336-882-2125   °[facility]  336-784-9470   °[facility]  336-285-9073   °[facility]  336-845-3988   °Residential & Outpatient Substance Abuse Program  1-800-659-3381   °Psychological Services °Organization         Address  Phone  Notes  °[facility]  336- 832-9600   °[facility]  336- 378-7881   °[facility] [address], [place] 1-800-853-5163 or 336-641-4981   ° °Mobile Crisis Teams °Organization         Address  Phone  Notes  °[facility], Mobile Crisis Care Unit  1-877-626-1772   °Assertive °[facility] ° [address]  [place], [place] 336-834-9664   °Sharon DeEsch [address] °[place] 336-554-5454   ° °Self-Help/Support Groups °Organization         Address  Phone             Notes  °[facility]  - variety of support groups  336- 373-1402 Call for more information  °Narcotics Anonymous (NA), [facility] [address], °[place]  2 meetings at this location  ° °  Residential Treatment Programs °Organization         Address  Phone  Notes  °[facility] [address],    °[place]  1-866-801-8205   °[facility] ° [address], [place], [place] 704-293-8524   °[facility] [address], [place] 336-845-3988 Admissions: 8am-3pm M-F  °[facility] [address]    °[place], [place] 336-841-1104   °[facility] [address], [place], [place] 336-379-7146   °[facility] [address]  °[place], [place] 336-285-9073   °[facility] - Intensive Outpatient [address], [place], [place] 336-852-3033   °[facility] [address]  °[place], [place] 1-877-615-2722 or 336-784-9470   °[facility] ([facility] [address] , [place] 336-227-7417 Accepts Medicaid  °[facility] [address]  °[place] 1-800-659-3381 Substance Abuse/Addiction Treatment  ° °[place] Behavioral Health Resources °Organization         Address  Phone  Notes  °[facility]  (888) 581-9988   °Julie Brannon, PhD [address] [place], [place]   (336) 349-5553 or (336) 951-0000   °[facility]   [address] °[place], [place] (336) 349-4454   °[facility] [address], [place], [place] (336) 342-8316 Insurance/Medicaid/sponsorship through [facility]  °[facility] [address]                                    [place], [place] (336) 342-8316 Therapy/tele-psych/case    °[facility] [address]  ° [place], [place] (336) 349-2233    °Dr. Arfeen  (336) 349-4544   °[facility] [place]  [employer] [place] [facility] 1) [address], [place] °2) [address], [place] °3)  [address], [place] (336) 349-3220 °(336) 342-7768 ° °(336) 342-8140   °[place] [facility] (336) 342-1394 or (336) 342-3537 (After Hours)    ° ° ° °Take your usual prescriptions as previously directed.  Call your regular medical doctor tomorrow to schedule a follow up appointment within the next 2 days. Return to the Emergency Department immediately sooner if worsening.  ° °

## 2015-01-16 NOTE — ED Provider Notes (Signed)
CSN: [id number]     Arrival date & time    History   First MD Initiated Contact with Patient 01/16/15 1621     Chief Complaint  Patient presents with  . Shortness of Breath      HPI Pt was seen at 1625. Per pt, c/o sudden onset and persistence of constant "SOB" that began approximately 1400 PTA. Pt states she was "just walking around at home" when her symptoms began. Denies any other symptoms. Denies CP/palpitations, no cough, no abd pain, no N/V/D, no back pain, no calf/LE pain or unilateral swelling.    Past Medical History  Diagnosis Date  . Breast CA 2006    s/p chemo,rad [name]  . Hyperlipidemia   . Diabetes mellitus   . Fecal incontinence   . Hypertension   . Constipation   . S/P colonoscopy Dec 2005, Nov 2010    2005: normal, 2010: lax sphincter tone, left-sided diverticula   . Diverticula of colon     L side  . 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 Partial hysterectomy    . Right lumpectomy    . Portacath placement and removal    . Colonoscopy  05/16/2004    Dr.Rourk- normal rectum, normal colon.  . Colonoscopy  05/02/2009    Dr.Rourk- lax sphincter tone, o/w normal rectum, few scattered L sided diverticula  . Breast biopsy Right 2014   Family History  Problem Relation Age of Onset  . Colon cancer Neg Hx   . Diabetes Mother   . Stroke Father   . Cancer Sister    History  Substance Use Topics  . Smoking status: Never Smoker   . Smokeless tobacco: Never Used  . Alcohol Use: No    Review of Systems ROS: Statement: All systems negative except as marked or noted in the HPI; Constitutional: Negative for fever and chills. ; ; Eyes: Negative for eye pain, redness and discharge. ; ; ENMT: Negative for ear pain, hoarseness, nasal congestion, sinus pressure and sore throat. ; ; Cardiovascular: Negative for chest pain, palpitations, diaphoresis,  and peripheral edema. ; ; Respiratory: +SOB. Negative for cough, wheezing and stridor. ; ; Gastrointestinal: Negative for nausea, vomiting, diarrhea, abdominal pain, blood in stool, hematemesis, jaundice and rectal bleeding. . ; ; Genitourinary: Negative for dysuria, flank pain and hematuria. ; ; Musculoskeletal: Negative for back pain and neck pain. Negative for swelling and trauma.; ; Skin: Negative for pruritus, rash, abrasions, blisters, bruising and skin lesion.; ; Neuro: Negative for headache, lightheadedness and neck stiffness. Negative for weakness, altered level of consciousness , altered mental status, extremity weakness, paresthesias, involuntary movement, seizure and syncope.      Allergies  Penicillins  Home Medications   Prior to Admission medications   Medication Sig Start Date End Date Taking? Authorizing Provider  aspirin 81 MG tablet Take 81 mg by mouth daily.      Historical Provider, MD  Calcium Carbonate-Vitamin D (CALCIUM + D) 600-200 MG-UNIT TABS Take 600 mg/day by mouth 2 (two) times daily.      Historical Provider, MD  FIBER PO Take 1 capsule by mouth daily.    Historical Provider, MD  losartan-hydrochlorothiazide (HYZAAR) 100-25 MG per tablet Take 1 tablet by mouth daily.    Historical Provider, MD  metFORMIN (GLUMETZA) 500 MG (MOD) 24 hr tablet Take 500 mg by mouth 2 (two)  times daily with a meal.      Historical Provider, MD  simvastatin (ZOCOR) 40 MG tablet Take 40 mg by mouth daily.    Historical Provider, MD  verapamil (CALAN-SR) 240 MG CR tablet Take 240 mg by mouth at bedtime.      Historical Provider, MD   BP 142/77 mmHg  Pulse 76  Temp(Src) 97.8 F (36.6 C) (Oral)  Resp 14  Ht 5\' 7"  (1.702 m)  Wt 157 lb (71.215 kg)  BMI 24.58 kg/m2  SpO2 98% Physical Exam  1630: Physical examination:  Nursing notes reviewed; Vital signs and O2 SAT reviewed;  Constitutional: Well developed, Well nourished, Well hydrated, In no acute distress; Head:  Normocephalic,  atraumatic; Eyes: EOMI, PERRL, No scleral icterus; ENMT: Mouth and pharynx normal, Mucous membranes moist; Neck: Supple, Full range of motion, No lymphadenopathy; Cardiovascular: Regular rate and rhythm, No gallop; Respiratory: Breath sounds clear & equal bilaterally, No wheezes.  Speaking full sentences with ease, Normal respiratory effort/excursion; Chest: Nontender, Movement normal; Abdomen: Soft, Nontender, Nondistended, Normal bowel sounds; Genitourinary: No CVA tenderness; Extremities: Pulses normal, No tenderness, No edema, No calf edema or asymmetry.; Neuro: AA&Ox3, vague historian. Major CN grossly intact.  Speech clear. No gross focal motor or sensory deficits in extremities.; Skin: Color normal, Warm, Dry.; Psych:  Anxious.    ED Course  Procedures     EKG Interpretation   Date/Time:  Tuesday January 16 2015 16:29:02 EDT Ventricular Rate:  76 PR Interval:  199 QRS Duration: 101 QT Interval:  384 QTC Calculation: 432 R Axis:   30 Text Interpretation:  Sinus rhythm Low voltage, precordial leads Abnormal  R-wave progression, early transition Baseline wander When compared with  ECG of 03/10/2005 No significant change was found Confirmed by [facility]  MD,  Nunzio Cory [phone]) on 01/16/2015 4:34:29 PM      MDM  MDM Reviewed: previous chart, nursing note and vitals Reviewed previous: labs and ECG Interpretation: labs, ECG and CT scan   Results for orders placed or performed during the hospital encounter of 78/93/81  Basic metabolic panel  Result Value Ref Range   Sodium 131 (L) 135 - 145 mmol/L   Potassium 3.1 (L) 3.5 - 5.1 mmol/L   Chloride 98 (L) 101 - 111 mmol/L   CO2 19 (L) 22 - 32 mmol/L   Glucose, Bld 88 65 - 99 mg/dL   BUN 22 (H) 6 - 20 mg/dL   Creatinine, Ser 0.87 0.44 - 1.00 mg/dL   Calcium 9.6 8.9 - 10.3 mg/dL   GFR calc non Af Amer >60 >60 mL/min   GFR calc Af Amer >60 >60 mL/min   Anion gap 14 5 - 15  Brain natriuretic peptide  Result Value Ref Range   B  Natriuretic Peptide 19.0 0.0 - 100.0 pg/mL  Troponin I  Result Value Ref Range   Troponin I <0.03 <0.031 ng/mL  Lactic acid, plasma  Result Value Ref Range   Lactic Acid, Venous 4.2 (HH) 0.5 - 2.0 mmol/L  Lactic acid, plasma  Result Value Ref Range   Lactic Acid, Venous 2.4 (HH) 0.5 - 2.0 mmol/L  CBC with Differential  Result Value Ref Range   WBC 4.1 4.0 - 10.5 K/uL   RBC 4.68 3.87 - 5.11 MIL/uL   Hemoglobin 12.2 12.0 - 15.0 g/dL   HCT 35.8 (L) 36.0 - 46.0 %   MCV 76.5 (L) 78.0 - 100.0 fL   MCH 26.1 26.0 - 34.0 pg   MCHC 34.1 30.0 - 36.0  g/dL   RDW 13.1 11.5 - 15.5 %   Platelets 246 150 - 400 K/uL   Neutrophils Relative % 51 43 - 77 %   Neutro Abs 2.1 1.7 - 7.7 K/uL   Lymphocytes Relative 40 12 - 46 %   Lymphs Abs 1.6 0.7 - 4.0 K/uL   Monocytes Relative 8 3 - 12 %   Monocytes Absolute 0.3 0.1 - 1.0 K/uL   Eosinophils Relative 1 0 - 5 %   Eosinophils Absolute 0.0 0.0 - 0.7 K/uL   Basophils Relative 0 0 - 1 %   Basophils Absolute 0.0 0.0 - 0.1 K/uL  Urinalysis, Routine w reflex microscopic (not at [facility])  Result Value Ref Range   Color, Urine STRAW (A) YELLOW   APPearance CLEAR CLEAR   Specific Gravity, Urine <1.005 (L) 1.005 - 1.030   pH 5.5 5.0 - 8.0   Glucose, UA NEGATIVE NEGATIVE mg/dL   Hgb urine dipstick NEGATIVE NEGATIVE   Bilirubin Urine NEGATIVE NEGATIVE   Ketones, ur NEGATIVE NEGATIVE mg/dL   Protein, ur NEGATIVE NEGATIVE mg/dL   Urobilinogen, UA 0.2 0.0 - 1.0 mg/dL   Nitrite NEGATIVE NEGATIVE   Leukocytes, UA TRACE (A) NEGATIVE  Troponin I  Result Value Ref Range   Troponin I <0.03 <0.031 ng/mL  Urine microscopic-add on  Result Value Ref Range   Squamous Epithelial / LPF RARE RARE   WBC, UA 0-2 <3 WBC/hpf   Dg Chest 2 View 01/16/2015   CLINICAL DATA:  Short of breath.  Diabetes and hypertension  EXAM: CHEST  2 VIEW  COMPARISON:  04/14/2005  FINDINGS: Heart size and vascularity normal. Lungs are clear without infiltrate effusion or mass. Mild apical  scarring bilaterally.  IMPRESSION: No active cardiopulmonary disease.   Electronically Signed   By: Franchot Gallo M.D.   On: 01/16/2015 17:01   Ct Angio Chest Pe W/cm &/or Wo Cm 01/16/2015   CLINICAL DATA:  Sob since 1400 getting worse; hx rt breast ca 2006 lumpectomy/chemo/rad ; rt breast ca 2014 mastectomy  EXAM: CT ANGIOGRAPHY CHEST WITH CONTRAST  TECHNIQUE: Multidetector CT imaging of the chest was performed using the standard protocol during bolus administration of intravenous contrast. Multiplanar CT image reconstructions and MIPs were obtained to evaluate the vascular anatomy.  CONTRAST:  149mL OMNIPAQUE IOHEXOL 350 MG/ML SOLN  COMPARISON:  Current chest radiograph  FINDINGS: Angiographic Study: No PE. Mild prominence of the ascending aorta measuring 3.6 cm. No dissection or plaque.  Thoracic inlet:  No mass or adenopathy.  Mediastinum and Hila: Heart normal in size. No mediastinal or hilar masses or enlarged lymph nodes.  Lungs and pleura: Few minor areas of reticular scarring. No lung consolidation or edema. No mass or suspicious nodule. No pleural effusion or pneumothorax. Mild apical pleural parenchymal scarring bilaterally.  Limited upper abdomen: Probable partly imaged left renal sinus cyst. Probable fatty infiltration of liver. No acute findings.  Musculoskeletal: Mild thoracic dextroscoliosis. No osteoblastic or osteolytic lesions.  Review of the MIP images confirms the above findings.  IMPRESSION: 1. No evidence of a pulmonary embolism. 2. No acute findings. No findings to explain this patient's shortness of breath.   Electronically Signed   By: Lajean Manes M.D.   On: 01/16/2015 18:41       1725:  Pt ambulated with O2 Sats 98% R/A and HR 88. Pt denied any complaints.   1850:  Pt now admits that her symptoms began after she was "using a hot spice" product while cooking. Pt  also now states that "my husband walked into the house and started to cough while I was using it, so then I started to  cough and feel SOB."  Pt states she "feels fine now." VS remain stable. Lactic acid elevated, but no acute findings on CXR or CT-chest. Will obtain UA and repeat lactic acid after IVF NS 1L bolus. Potassium repleted PO without N/V. Pt agreeable with plan.  2045:  Pt continues to "feel ok." VS, including O2 Sats, remain stable. Pt continues to deny CP. Lactic acid has trended down after IVF. No UTI on Udip. Troponin negative. Pt states she wants to go home now. Dx and testing d/w pt and family.  Questions answered.  Verb understanding, agreeable to d/c home with outpt f/u.   Francine Graven, DO 01/19/15 1640

## 2015-01-16 NOTE — ED Notes (Signed)
Patient c/o of SOB which began around 1400 and has progressively worsened.

## 2015-03-06 ENCOUNTER — Other Ambulatory Visit (HOSPITAL_COMMUNITY): Payer: Self-pay | Admitting: Hematology & Oncology

## 2015-03-06 DIAGNOSIS — Z1231 Encounter for screening mammogram for malignant neoplasm of breast: Secondary | ICD-10-CM

## 2015-04-12 ENCOUNTER — Ambulatory Visit (HOSPITAL_COMMUNITY)
Admission: RE | Admit: 2015-04-12 | Discharge: 2015-04-12 | Disposition: A | Discharge status: Home / Self Care | Payer: Medicare Other | Source: Ambulatory Visit | Attending: Hematology & Oncology | Admitting: Hematology & Oncology

## 2015-04-12 DIAGNOSIS — Z1231 Encounter for screening mammogram for malignant neoplasm of breast: Secondary | ICD-10-CM | POA: Diagnosis present

## 2015-04-27 ENCOUNTER — Encounter: Payer: Self-pay | Admitting: "Endocrinology

## 2015-04-27 ENCOUNTER — Ambulatory Visit (INDEPENDENT_AMBULATORY_CARE_PROVIDER_SITE_OTHER): Payer: Medicare Other | Admitting: "Endocrinology

## 2015-04-27 VITALS — BP 128/75 | HR 76 | Ht 67.0 in | Wt 162.0 lb

## 2015-04-27 DIAGNOSIS — E079 Disorder of thyroid, unspecified: Secondary | ICD-10-CM | POA: Diagnosis not present

## 2015-04-28 LAB — THYROGLOBULIN ANTIBODY: Thyroglobulin Ab: <1 IU/mL (ref <2)

## 2015-04-28 LAB — TSH: TSH: 1.872 u[IU]/mL (ref 0.350–4.500)

## 2015-04-28 LAB — THYROID PEROXIDASE ANTIBODY

## 2015-04-28 LAB — T4, FREE: FREE T4: 0.99 ng/dL (ref 0.80–1.80)

## 2015-04-29 ENCOUNTER — Encounter: Payer: Self-pay | Admitting: "Endocrinology

## 2015-04-29 NOTE — Progress Notes (Signed)
Subjective:    Patient ID: Michaela Shaffer, female    DOB: 06-28-1945,    Past Medical History  Diagnosis Date  . Breast CA ([place]) 2006    s/p chemo,rad [name]  . Hyperlipidemia   . Diabetes mellitus   . Fecal incontinence   . Hypertension   . Constipation   . S/P colonoscopy Dec 2005, Nov 2010    2005: normal, 2010: lax sphincter tone, left-sided diverticula   . Diverticula of colon     L side  . 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 Partial hysterectomy    . Right lumpectomy    . Portacath placement and removal    . Colonoscopy  05/16/2004    Dr.Rourk- normal rectum, normal colon.  . Colonoscopy  05/02/2009    Dr.Rourk- lax sphincter tone, o/w normal rectum, few scattered L sided diverticula  . Breast biopsy Right 2014   Social History   Social History  . Marital Status: Married    Spouse Name: N/A  . Number of Children: N/A  . Years of Education: N/A   Social History Main Topics  . Smoking status: Never Smoker   . Smokeless tobacco: Never Used  . Alcohol Use: No  . Drug Use: No  . Sexual Activity: Not Asked   Other Topics Concern  . None   Social History Narrative   Outpatient Encounter Prescriptions as of 04/27/2015  Medication Sig  . aspirin 81 MG tablet Take 81 mg by mouth daily.    . Calcium Carbonate-Vitamin D (CALCIUM + D) 600-200 MG-UNIT TABS Take 600 mg/day by mouth daily.   . Inulin (FIBER CHOICE PO) Take by mouth daily.  Marland Kitchen losartan-hydrochlorothiazide (HYZAAR) 100-25 MG per tablet Take 1 tablet by mouth daily.  . metFORMIN (GLUCOPHAGE) 500 MG tablet Take 500 mg by mouth 2 (two) times daily with a meal.  . simvastatin (ZOCOR) 40 MG tablet Take 40 mg by mouth every evening.   . verapamil (CALAN-SR) 240 MG CR tablet Take 240 mg by mouth at bedtime.    Marland Kitchen zolpidem (AMBIEN) 10 MG tablet Take 10 mg by mouth at bedtime as  needed.   No facility-administered encounter medications on file as of 04/27/2015.   ALLERGIES: Allergies  Allergen Reactions  . Penicillins     REACTION: unknown reaction   VACCINATION STATUS:  There is no immunization history on file for this patient.  HPI  Michaela Shaffer is a 69 year old female patient with medical history as above. She is being seen in consultation for hyperthyroidism requested by Dr. Gerarda Fraction. She was having annual physical when she was found to have a TSH of 0.022 on 01/18/2015. On further interview patient denies any prior history of thyroid dysfunction. She denies any family history of thyroid problem nor thyroid cancer. She is a survivor of breast cancer for which she received surgery and radiotherapy prior to 2007. Patient is not on any thyroid supplement nor any antithyroid medications.  Review of Systems Constitutional: no weight gain/loss, no fatigue, no subjective hyperthermia/hypothermia Eyes: no blurry vision, no xerophthalmia ENT: no sore throat, no nodules palpated in throat, no dysphagia/odynophagia, no hoarseness Cardiovascular: no CP/SOB/palpitations/leg swelling Respiratory: no cough/SOB Gastrointestinal: no N/V/D/C Musculoskeletal: no muscle/joint aches Skin: no rashes Neurological: no tremors/numbness/tingling/dizziness Psychiatric: no depression/anxiety  Objective:    BP 128/75 mmHg  Pulse 76  Ht  5\' 7"  (1.702 m)  Wt 162 lb (73.483 kg)  BMI 25.37 kg/m2  SpO2 76%  Wt Readings from Last 3 Encounters:  04/27/15 162 lb (73.483 kg)  01/16/15 157 lb (71.215 kg)  10/05/14 167 lb 4.8 oz (75.887 kg)    Physical Exam Constitutional: overweight, in NAD Eyes: PERRLA, EOMI, no exophthalmos ENT: moist mucous membranes, no thyromegaly, no cervical lymphadenopathy Cardiovascular: RRR, No MRG Respiratory: CTA B Gastrointestinal: abdomen soft, NT, ND, BS+ Musculoskeletal: no deformities, strength intact in all 4 Skin: moist, warm, no  rashes Neurological: no tremor with outstretched hands, DTR normal in all 4  Results for orders placed or performed in visit on 04/27/15  T4, free  Result Value Ref Range   Free T4 0.99 0.80 - 1.80 ng/dL  TSH  Result Value Ref Range   TSH 1.872 0.350 - 4.500 uIU/mL  Thyroglobulin antibody  Result Value Ref Range   Thyroglobulin Ab <1 <2 IU/mL  Thyroid peroxidase antibody  Result Value Ref Range   Thyroperoxidase Ab SerPl-aCnc <1 <9 IU/mL   Complete Blood Count (Most recent): Lab Results  Component Value Date   WBC 4.1 01/16/2015   HGB 12.2 01/16/2015   HCT 35.8* 01/16/2015   MCV 76.5* 01/16/2015   PLT 246 01/16/2015   Chemistry (most recent): Lab Results  Component Value Date   NA 131* 01/16/2015   K 3.1* 01/16/2015   CL 98* 01/16/2015   CO2 19* 01/16/2015   BUN 22* 01/16/2015   CREATININE 0.87 01/16/2015         Assessment & Plan:   1. Disorder of thyroid gland -Patient's history and records are reviewed. At this point thyroid dysfunction is not confirmed. I will proceed to obtain full profile thyroid function test today including: - T4, free - TSH - Thyroglobulin antibody - Thyroid peroxidase antibody  She will return in 1 week for treatment decision if necessary. Physical exam is negative for goiter hence no need for imaging of the thyroid.   I advised patient to maintain close follow up with their PCP for primary care needs. Follow up plan: Return in about 1 week (around 05/04/2015) for abnornmal thyroid function.  Glade Lloyd, MD Phone: [phone]  Fax: [phone]   04/29/2015, 4:55 PM

## 2015-05-04 ENCOUNTER — Ambulatory Visit (INDEPENDENT_AMBULATORY_CARE_PROVIDER_SITE_OTHER): Payer: Medicare Other | Admitting: "Endocrinology

## 2015-05-04 ENCOUNTER — Encounter: Payer: Self-pay | Admitting: "Endocrinology

## 2015-05-04 VITALS — BP 108/69 | HR 82 | Ht 67.0 in | Wt 165.2 lb

## 2015-05-04 DIAGNOSIS — E079 Disorder of thyroid, unspecified: Secondary | ICD-10-CM

## 2015-05-04 NOTE — Progress Notes (Signed)
Subjective:    Patient ID: Michaela Shaffer, female    DOB: 04-25-1946,    Past Medical History  Diagnosis Date  . Breast CA ([place]) 2006    s/p chemo,rad [name]  . Hyperlipidemia   . Diabetes mellitus   . Fecal incontinence   . Hypertension   . Constipation   . S/P colonoscopy Dec 2005, Nov 2010    2005: normal, 2010: lax sphincter tone, left-sided diverticula   . Diverticula of colon     L side  . 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 Partial hysterectomy    . Right lumpectomy    . Portacath placement and removal    . Colonoscopy  05/16/2004    Dr.Rourk- normal rectum, normal colon.  . Colonoscopy  05/02/2009    Dr.Rourk- lax sphincter tone, o/w normal rectum, few scattered L sided diverticula  . Breast biopsy Right 2014   Social History   Social History  . Marital Status: Married    Spouse Name: N/A  . Number of Children: N/A  . Years of Education: N/A   Social History Main Topics  . Smoking status: Never Smoker   . Smokeless tobacco: Never Used  . Alcohol Use: No  . Drug Use: No  . Sexual Activity: Not Asked   Other Topics Concern  . None   Social History Narrative   Outpatient Encounter Prescriptions as of 05/04/2015  Medication Sig  . aspirin 81 MG tablet Take 81 mg by mouth daily.    . Calcium Carbonate-Vitamin D (CALCIUM + D) 600-200 MG-UNIT TABS Take 600 mg/day by mouth daily.   . Inulin (FIBER CHOICE PO) Take by mouth daily.  Marland Kitchen losartan-hydrochlorothiazide (HYZAAR) 100-25 MG per tablet Take 1 tablet by mouth daily.  . metFORMIN (GLUCOPHAGE) 500 MG tablet Take 500 mg by mouth 2 (two) times daily with a meal.  . simvastatin (ZOCOR) 40 MG tablet Take 40 mg by mouth every evening.   . verapamil (CALAN-SR) 240 MG CR tablet Take 240 mg by mouth at bedtime.    Marland Kitchen zolpidem (AMBIEN) 10 MG tablet Take 10 mg by mouth at bedtime as  needed.   No facility-administered encounter medications on file as of 05/04/2015.   ALLERGIES: Allergies  Allergen Reactions  . Penicillins     REACTION: unknown reaction   VACCINATION STATUS:  There is no immunization history on file for this patient.  HPI  Michaela Shaffer is a 69 year old female patient with medical history as above. She is returning to follow-up after repeat labs.  She was having annual physical when she was found to have a TSH of 0.022 on 01/18/2015. -Since her last visit, her repeat thyroid function tests are all within normal limits. On further interview patient denies any prior history of thyroid dysfunction. She denies any family history of thyroid problem nor thyroid cancer. She is a survivor of breast cancer for which she received surgery and radiotherapy prior to 2007. Patient is not on any thyroid supplement nor any antithyroid medications.  Review of Systems Constitutional: no weight gain/loss, no fatigue, no subjective hyperthermia/hypothermia Eyes: no blurry vision, no xerophthalmia ENT: no sore throat, no nodules palpated in throat, no dysphagia/odynophagia, no hoarseness Cardiovascular: no CP/SOB/palpitations/leg swelling Respiratory: no cough/SOB Gastrointestinal: no N/V/D/C Musculoskeletal: no muscle/joint aches Skin: no rashes Neurological: no tremors/numbness/tingling/dizziness Psychiatric: no depression/anxiety  Objective:  BP 108/69 mmHg  Pulse 82  Ht 5\' 7"  (1.702 m)  Wt 165 lb 3.2 oz (74.934 kg)  BMI 25.87 kg/m2  SpO2 97%  Wt Readings from Last 3 Encounters:  05/04/15 165 lb 3.2 oz (74.934 kg)  04/27/15 162 lb (73.483 kg)  01/16/15 157 lb (71.215 kg)    Physical Exam Constitutional: overweight, in NAD Eyes: PERRLA, EOMI, no exophthalmos ENT: moist mucous membranes, no thyromegaly, no cervical lymphadenopathy Cardiovascular: RRR, No MRG Respiratory: CTA B Gastrointestinal: abdomen soft, NT, ND, BS+ Musculoskeletal: no  deformities, strength intact in all 4 Skin: moist, warm, no rashes Neurological: no tremor with outstretched hands, DTR normal in all 4  Results for orders placed or performed in visit on 04/27/15  T4, free  Result Value Ref Range   Free T4 0.99 0.80 - 1.80 ng/dL  TSH  Result Value Ref Range   TSH 1.872 0.350 - 4.500 uIU/mL  Thyroglobulin antibody  Result Value Ref Range   Thyroglobulin Ab <1 <2 IU/mL  Thyroid peroxidase antibody  Result Value Ref Range   Thyroperoxidase Ab SerPl-aCnc <1 <9 IU/mL   Complete Blood Count (Most recent): Lab Results  Component Value Date   WBC 4.1 01/16/2015   HGB 12.2 01/16/2015   HCT 35.8* 01/16/2015   MCV 76.5* 01/16/2015   PLT 246 01/16/2015   Chemistry (most recent): Lab Results  Component Value Date   NA 131* 01/16/2015   K 3.1* 01/16/2015   CL 98* 01/16/2015   CO2 19* 01/16/2015   BUN 22* 01/16/2015   CREATININE 0.87 01/16/2015      Assessment & Plan:   1. Disorder of thyroid gland -Patient's history and records are reviewed. Her repeat full profile thyroid function tests are within normal limits, see above. -She will not need intervention for thyroid function at this point. She will return in 1 year with repeat thyroid function tests. Physical exam is negative for goiter hence no need for imaging of the thyroid.  I advised patient to maintain close follow up with her  PCP for primary care needs. Follow up plan: Return in about 1 year (around 05/03/2016) for follow up with pre-visit labs.  Glade Lloyd, MD Phone: [phone]  Fax: [phone]   05/04/2015, 4:20 PM

## 2015-08-24 ENCOUNTER — Other Ambulatory Visit (HOSPITAL_COMMUNITY): Payer: Self-pay | Admitting: Family Medicine

## 2015-08-24 ENCOUNTER — Ambulatory Visit (HOSPITAL_COMMUNITY)
Admission: RE | Admit: 2015-08-24 | Discharge: 2015-08-24 | Disposition: A | Discharge status: Home / Self Care | Payer: Medicare Other | Source: Ambulatory Visit | Attending: Family Medicine | Admitting: Family Medicine

## 2015-08-24 DIAGNOSIS — J984 Other disorders of lung: Secondary | ICD-10-CM | POA: Diagnosis not present

## 2015-08-24 DIAGNOSIS — J069 Acute upper respiratory infection, unspecified: Secondary | ICD-10-CM

## 2015-09-10 ENCOUNTER — Other Ambulatory Visit (HOSPITAL_COMMUNITY): Payer: Self-pay | Admitting: Family Medicine

## 2015-09-10 DIAGNOSIS — R229 Localized swelling, mass and lump, unspecified: Principal | ICD-10-CM

## 2015-09-10 DIAGNOSIS — IMO0002 Reserved for concepts with insufficient information to code with codable children: Secondary | ICD-10-CM

## 2015-09-13 ENCOUNTER — Ambulatory Visit (HOSPITAL_COMMUNITY)
Admission: RE | Admit: 2015-09-13 | Discharge: 2015-09-13 | Disposition: A | Discharge status: Home / Self Care | Payer: Medicare Other | Source: Ambulatory Visit | Attending: Family Medicine | Admitting: Family Medicine

## 2015-09-13 DIAGNOSIS — R911 Solitary pulmonary nodule: Secondary | ICD-10-CM | POA: Insufficient documentation

## 2015-09-13 DIAGNOSIS — I2510 Atherosclerotic heart disease of native coronary artery without angina pectoris: Secondary | ICD-10-CM | POA: Diagnosis not present

## 2015-09-13 DIAGNOSIS — IMO0002 Reserved for concepts with insufficient information to code with codable children: Secondary | ICD-10-CM

## 2015-09-13 DIAGNOSIS — R229 Localized swelling, mass and lump, unspecified: Secondary | ICD-10-CM

## 2015-09-13 LAB — POCT I-STAT CREATININE: CREATININE: 0.9 mg/dL (ref 0.44–1.00)

## 2015-09-13 MED ORDER — IOHEXOL 300 MG/ML  SOLN
75.0000 mL | Freq: Once | INTRAMUSCULAR | Status: AC | PRN
  Administered 2015-09-13: 75 mL via INTRAVENOUS

## 2015-09-25 ENCOUNTER — Encounter: Payer: Self-pay | Admitting: *Deleted

## 2015-09-26 ENCOUNTER — Encounter: Payer: Self-pay | Admitting: Cardiovascular Disease

## 2015-09-26 ENCOUNTER — Ambulatory Visit (INDEPENDENT_AMBULATORY_CARE_PROVIDER_SITE_OTHER): Payer: Medicare Other | Admitting: Cardiovascular Disease

## 2015-09-26 VITALS — BP 118/60 | HR 71 | Ht 67.0 in | Wt 154.0 lb

## 2015-09-26 DIAGNOSIS — E785 Hyperlipidemia, unspecified: Secondary | ICD-10-CM

## 2015-09-26 DIAGNOSIS — I10 Essential (primary) hypertension: Secondary | ICD-10-CM | POA: Diagnosis not present

## 2015-09-26 DIAGNOSIS — I2510 Atherosclerotic heart disease of native coronary artery without angina pectoris: Secondary | ICD-10-CM | POA: Diagnosis not present

## 2015-09-26 NOTE — Patient Instructions (Signed)
Your physician recommends that you continue on your current medications as directed. Please refer to the Current Medication list given to you today.  Your physician recommends that you schedule a follow-up appointment in: as needed  

## 2015-09-26 NOTE — Progress Notes (Signed)
Patient ID: Michaela Shaffer, female   DOB: 18-Feb-1946, 70 y.o.   MRN: [id number]       CARDIOLOGY CONSULT NOTE  Patient ID: Michaela Shaffer MRN: [id number] DOB/AGE: 1945/10/18 70 y.o.  Admit date: (Not on file) Primary Physician [name], MD  Reason for Consultation: CAD  HPI: The patient is a 70 year old woman with a history of hypertension, hyperlipidemia, and diabetes mellitus who is referred for the evaluation of coronary artery disease. She recently underwent a chest CT with contrast on 09/13/15 which demonstrated trace calcification along the LAD.  A review of labs performed on 07/31/15 showed hemoglobin 13.8, platelets 254, BUN 21, creatinine 0.97, total cholesterol 156, triglycerides 109, HDL 60, LDL 74.  The patient denies any symptoms of chest pain, palpitations, shortness of breath, lightheadedness, dizziness, leg swelling, orthopnea, PND, and syncope.  ECG performed in the office today demonstrates normal sinus rhythm with no ischemic ST segment or T-wave abnormalities, nor any arrhythmias.    Allergies  Allergen Reactions  . Penicillins     REACTION: unknown reaction    Current Outpatient Prescriptions  Medication Sig Dispense Refill  . aspirin 81 MG tablet Take 81 mg by mouth daily.      . Calcium Carbonate-Vitamin D (CALCIUM + D) 600-200 MG-UNIT TABS Take 600 mg/day by mouth daily.     Marland Kitchen losartan-hydrochlorothiazide (HYZAAR) 100-25 MG per tablet Take 1 tablet by mouth daily.    . metFORMIN (GLUCOPHAGE) 500 MG tablet Take 500 mg by mouth 2 (two) times daily with a meal.    . simvastatin (ZOCOR) 40 MG tablet Take 40 mg by mouth every evening.     . verapamil (CALAN-SR) 240 MG CR tablet Take 240 mg by mouth at bedtime.       No current facility-administered medications for this visit.    Past Medical History  Diagnosis Date  . Breast CA ([place]) 2006    s/p chemo,rad [name]  . Hyperlipidemia   . Diabetes mellitus   . Fecal incontinence   . Hypertension     . Constipation   . S/P colonoscopy Dec 2005, Nov 2010    2005: normal, 2010: lax sphincter tone, left-sided diverticula   . Diverticula of colon     L side  . 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 Partial hysterectomy    . Right lumpectomy    . Portacath placement and removal    . Colonoscopy  05/16/2004    Dr.Rourk- normal rectum, normal colon.  . Colonoscopy  05/02/2009    Dr.Rourk- lax sphincter tone, o/w normal rectum, few scattered L sided diverticula  . Breast biopsy Right 2014    Social History   Social History  . Marital Status: Married    Spouse Name: N/A  . Number of Children: N/A  . Years of Education: N/A   Occupational History  . Not on file.   Social History Main Topics  . Smoking status: Never Smoker   . Smokeless tobacco: Never Used  . Alcohol Use: No  . Drug Use: No  . Sexual Activity: Not on file   Other Topics Concern  . Not on file   Social History Narrative     No family history of premature CAD in 1st degree relatives.  Prior to Admission medications   Medication Sig Start Date End Date Taking? Authorizing  Provider  aspirin 81 MG tablet Take 81 mg by mouth daily.     Yes Historical Provider, MD  Calcium Carbonate-Vitamin D (CALCIUM + D) 600-200 MG-UNIT TABS Take 600 mg/day by mouth daily.    Yes Historical Provider, MD  losartan-hydrochlorothiazide (HYZAAR) 100-25 MG per tablet Take 1 tablet by mouth daily.   Yes Historical Provider, MD  metFORMIN (GLUCOPHAGE) 500 MG tablet Take 500 mg by mouth 2 (two) times daily with a meal.   Yes Historical Provider, MD  simvastatin (ZOCOR) 40 MG tablet Take 40 mg by mouth every evening.    Yes Historical Provider, MD  verapamil (CALAN-SR) 240 MG CR tablet Take 240 mg by mouth at bedtime.     Yes Historical Provider, MD     Review of systems complete and found to be negative  unless listed above in HPI     Physical exam Blood pressure 118/60, pulse 71, height 5\' 7"  (1.702 m), weight 154 lb (69.854 kg), SpO2 98 %. General: NAD Neck: No JVD, no thyromegaly or thyroid nodule.  Lungs: Clear to auscultation bilaterally with normal respiratory effort. CV: Nondisplaced PMI. Regular rate and rhythm, normal S1/S2, no S3/S4, no murmur.  No peripheral edema.  No carotid bruit.  Normal pedal pulses.  Abdomen: Soft, nontender, no hepatosplenomegaly, no distention.  Skin: Intact without lesions or rashes.  Neurologic: Alert and oriented x 3.  Psych: Normal affect. Extremities: No clubbing or cyanosis.  HEENT: Normal.   ECG: Most recent ECG reviewed.  Labs:   Lab Results  Component Value Date   WBC 4.1 01/16/2015   HGB 12.2 01/16/2015   HCT 35.8* 01/16/2015   MCV 76.5* 01/16/2015   PLT 246 01/16/2015   No results for input(s): NA, K, CL, CO2, BUN, CREATININE, CALCIUM, PROT, BILITOT, ALKPHOS, ALT, AST, GLUCOSE in the last 168 hours.  Invalid input(s): LABALBU Lab Results  Component Value Date   TROPONINI <0.03 01/16/2015   No results found for: CHOL No results found for: HDL No results found for: LDLCALC No results found for: TRIG No results found for: CHOLHDL No results found for: LDLDIRECT       Studies: No results found.  ASSESSMENT AND PLAN:  1. CAD: Multiple CV risk factors but entirely asymptomatic with a normal ECG. I do not feel any cardiac testing is warranted at this time.  2. Essential HTN: Controlled. No changes.  3. Hyperlipidemia: On statin therapy and well controlled. No changes.  Dispo: fu prn.  Signed: Kate Sable, M.D., F.A.C.C.  09/26/2015, 9:35 AM

## 2015-10-05 ENCOUNTER — Ambulatory Visit (HOSPITAL_COMMUNITY): Payer: Medicare Other | Admitting: Hematology & Oncology

## 2015-10-05 NOTE — Progress Notes (Signed)
This encounter was created in error - please disregard.

## 2015-12-25 NOTE — Assessment & Plan Note (Signed)
Stage I (T1 C. 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 She had her definitive surgery on 03/10/2005. This is a grade 3 cancer 1.6 cm, with one negative sentinel lymph node. No LV I was seen. She took FEC x4 cycles every 21 days followed by dose dense Taxotere navelbine for 4 cycles every 14 days finishing all therapy as of 08/21/2005. She was then treated with radiation therapy by [name] and remains free of disease.  Oncology history is updated.  No role for labs today from an oncology perspective.  Chart is reviewed.  CT of chest was performed by PCP for persistent cough.  This exam was negative for any concerning oncology findings.  I personally reviewed and went over radiographic studies with the patient.  The results are noted within this dictation.  Mammogram in October 2016 was BIRADS 2.  She will be due for another screening mammogram in October 2017.  We will see her back in 1 year for follow-up.  She is a candidate for release from [facility] with regaular follow-up by primary care provider after > 10 years of oncology surveillance.

## 2015-12-25 NOTE — Progress Notes (Signed)
Michaela Shaffer., MD [place] [address]  Infiltrating ductal carcinoma of breast, right ([place]) - Plan: MM SCREENING BREAST TOMO BILATERAL  CURRENT THERAPY: Surveillance per NCCN guidelines.  INTERVAL HISTORY: Michaela Shaffer 70 y.o. female returns for followup of Stage I (T1 C. 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 She had her definitive surgery on 03/10/2005. This is a grade 3 cancer 1.6 cm, with one negative sentinel lymph node. No LV I was seen. She took FEC x4 cycles every 21 days followed by dose dense Taxotere navelbine for 4 cycles every 14 days finishing all therapy as of 08/21/2005. She was then treated with radiation therapy by [name] and remains free of disease.    Infiltrating ductal carcinoma of breast ([place])   02/13/2005 Initial Diagnosis Infiltrating ductal carcinoma of breast   03/10/2005 Surgery Right lumpectomy demonstrating a 1.6 cm invasive ductal carcinoma involving the deep margin with 0/1 lymph nodes for disease.  ER 0%, PR 0%, Her2 negative, Ki-67 38%   03/19/2005 Surgery Re-excision of deep margin showing no residula cancer   04/15/2005 - 06/19/2005 Chemotherapy FEC x 4 cycles   07/10/2005 - 08/21/2005 Chemotherapy Navelbine/Taxotere x 4 cycles   09/22/2005 - 11/06/2005 Radiation Therapy [name]   09/13/2015 Imaging CT chest performed by PCP for cough- Negative CT scan of the chest.   "I've had a great year!"  She denies any complaints and denies any oncology issues.  She denies any new breast changes or concerns.  She is up to date on her mammograms.  Review of Systems  Constitutional: Negative.   HENT: Negative.   Eyes: Negative.   Respiratory: Negative.   Cardiovascular: Negative.   Gastrointestinal: Negative.   Genitourinary: Negative.     Musculoskeletal: Negative.   Skin: Negative.   Neurological: Negative.   Endo/Heme/Allergies: Negative.   Psychiatric/Behavioral: Negative.     Past Medical History  Diagnosis Date  . Breast CA ([place]) 2006    s/p chemo,rad [name]  . Hyperlipidemia   . Diabetes mellitus   . Fecal incontinence   . Hypertension   . Constipation   . S/P colonoscopy Dec 2005, Nov 2010    2005: normal, 2010: lax sphincter tone, left-sided diverticula   . Diverticula of colon     L side  . Infiltrating ductal carcinoma of breast ([name]) 04/25/2009    Qualifier: History of  By: Zeb Comfort      Past Surgical History  Procedure Laterality Date  . Partial hysterectomy    . Right lumpectomy    . Portacath placement and removal    . Colonoscopy  05/16/2004    Dr.Rourk- normal rectum, normal colon.  . Colonoscopy  05/02/2009    Dr.Rourk- lax sphincter tone, o/w normal rectum, few scattered L sided diverticula  . Breast biopsy Right 2014    Family History  Problem Relation Age of Onset  . Colon cancer Neg Hx   . Diabetes Mother   . Stroke Father   . Cancer Sister     Social History   Social History  . Marital Status: Married    Spouse Name: N/A  . Number of Children: N/A  . Years of Education: N/A   Social History Main Topics  . Smoking status: Never Smoker   . Smokeless tobacco:  Never Used  . Alcohol Use: No  . Drug Use: No  . Sexual Activity: Not Asked   Other Topics Concern  . None   Social History Narrative     PHYSICAL EXAMINATION  ECOG PERFORMANCE STATUS: 0 - Asymptomatic  Filed Vitals:   12/26/15 1302  BP: 133/62  Pulse: 76  Temp: 97.7 F (36.5 C)  Resp: 18    GENERAL:alert, healthy, no distress, well nourished, well developed, comfortable, cooperative, smiling and unaccompanied SKIN: skin color, texture, turgor are normal, no rashes or significant lesions HEAD: Normocephalic, No masses, lesions, tenderness or abnormalities EYES: normal, EOMI,  Conjunctiva are pink and non-injected EARS: External ears normal OROPHARYNX:lips, buccal mucosa, and tongue normal and mucous membranes are moist  NECK: supple, no adenopathy, thyroid normal size, non-tender, without nodularity, no stridor, non-tender, trachea midline LYMPH:  no palpable lymphadenopathy, no hepatosplenomegaly BREAST:left breast normal without mass, skin or nipple changes or axillary nodes, right post-lumpectomy site well healed and free of suspicious changes LUNGS: clear to auscultation and percussion HEART: regular rate & rhythm, no murmurs, no gallops, S1 normal and S2 normal ABDOMEN:abdomen soft, non-tender, normal bowel sounds and no masses or organomegaly BACK: Back symmetric, no curvature. EXTREMITIES:less then 2 second capillary refill, no joint deformities, effusion, or inflammation, no skin discoloration, no clubbing, no cyanosis  NEURO: alert & oriented x 3 with fluent speech, no focal motor/sensory deficits, gait normal   LABORATORY DATA: CBC    Component Value Date/Time   WBC 4.1 01/16/2015 1636   RBC 4.68 01/16/2015 1636   HGB 12.2 01/16/2015 1636   HCT 35.8* 01/16/2015 1636   PLT 246 01/16/2015 1636   MCV 76.5* 01/16/2015 1636   MCH 26.1 01/16/2015 1636   MCHC 34.1 01/16/2015 1636   RDW 13.1 01/16/2015 1636   LYMPHSABS 1.6 01/16/2015 1636   MONOABS 0.3 01/16/2015 1636   EOSABS 0.0 01/16/2015 1636   BASOSABS 0.0 01/16/2015 1636      Chemistry      Component Value Date/Time   NA 131* 01/16/2015 1636   K 3.1* 01/16/2015 1636   CL 98* 01/16/2015 1636   CO2 19* 01/16/2015 1636   BUN 22* 01/16/2015 1636   CREATININE 0.90 09/13/2015 0946      Component Value Date/Time   CALCIUM 9.6 01/16/2015 1636   ALKPHOS 62 10/07/2013 1047   AST 16 10/07/2013 1047   ALT 18 10/07/2013 1047   BILITOT 0.2* 10/07/2013 1047        PENDING LABS:   RADIOGRAPHIC STUDIES:  No results found.   PATHOLOGY:    ASSESSMENT AND PLAN:  Infiltrating ductal  carcinoma of breast Stage I (T1 C. 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 She had her definitive surgery on 03/10/2005. This is a grade 3 cancer 1.6 cm, with one negative sentinel lymph node. No LV I was seen. She took FEC x4 cycles every 21 days followed by dose dense Taxotere navelbine for 4 cycles every 14 days finishing all therapy as of 08/21/2005. She was then treated with radiation therapy by [name] and remains free of disease.  Oncology history is updated.  No role for labs today from an oncology perspective.  Chart is reviewed.  CT of chest was performed by PCP for persistent cough.  This exam was negative for any concerning oncology findings.  I personally  reviewed and went over radiographic studies with the patient.  The results are noted within this dictation.  Mammogram in October 2016 was BIRADS 2.  She will be due for another screening mammogram in October 2017.  We will see her back in 1 year for follow-up.  She is a candidate for release from [facility] with regaular follow-up by primary care provider after > 10 years of oncology surveillance.    ORDERS PLACED FOR THIS ENCOUNTER: Orders Placed This Encounter  Procedures  . MM SCREENING BREAST TOMO BILATERAL    MEDICATIONS PRESCRIBED THIS ENCOUNTER: No orders of the defined types were placed in this encounter.    THERAPY PLAN:  NCCN guidelines recommends the following surveillance for invasive breast cancer (2.2017):  A. History and Physical exam 1-4 times per year as clinically appropriate for 5 years, then annually.  B. Periodic screening for changes in family history and referral to genetics counseling as indicated  C. Educate, monitor, and refer to lymphedema management.  D. Mammography every 12 months  E. Routine imaging of  reconstructed breast is not indicated.  F. In the absence of clinical signs and symptoms suggestive of recurrent disease, there is no indication for laboratory or imaging studies for metastases screening.  G. Women on Tamoxifen: annual gynecologic assessment every 12 months if uterus is present.  H. Women on aromatase inhibitor or who experience ovarian failure secondary to treatment should have monitoring of bone health with a bone mineral density determination at baseline and periodically thereafter.  I. Assess and encourage adherence to adjuvant endocrine therapy.  J. Evidence suggests that active lifestyle, healthy diet, limited alcohol intake, and achieving and maintaining an ideal body weight (20-25 BMI) may lead to optimal breast cancer outcomes.   All questions were answered. The patient knows to call the clinic with any problems, questions or concerns. We can certainly see the patient much sooner if necessary.  Patient and plan discussed with Dr. Ancil Linsey and she is in agreement with the aforementioned.   This note is electronically signed by: Doy Mince 12/26/2015 10:38 PM

## 2015-12-26 ENCOUNTER — Other Ambulatory Visit (HOSPITAL_COMMUNITY): Payer: Self-pay | Admitting: Oncology

## 2015-12-26 ENCOUNTER — Encounter (HOSPITAL_COMMUNITY): Payer: Self-pay | Admitting: Oncology

## 2015-12-26 ENCOUNTER — Encounter (HOSPITAL_COMMUNITY): Payer: Medicare Other | Attending: Oncology | Admitting: Oncology

## 2015-12-26 VITALS — BP 133/62 | HR 76 | Temp 97.7°F | Resp 18 | Wt 155.3 lb

## 2015-12-26 DIAGNOSIS — Z853 Personal history of malignant neoplasm of breast: Secondary | ICD-10-CM

## 2015-12-26 DIAGNOSIS — C50911 Malignant neoplasm of unspecified site of right female breast: Secondary | ICD-10-CM

## 2015-12-26 DIAGNOSIS — Z1231 Encounter for screening mammogram for malignant neoplasm of breast: Secondary | ICD-10-CM

## 2015-12-26 NOTE — Patient Instructions (Signed)
[place] at [facility] Discharge Instructions  RECOMMENDATIONS MADE BY THE CONSULTANT AND ANY TEST RESULTS WILL BE SENT TO YOUR REFERRING PHYSICIAN.  You were seen by Kirby Crigler, PA today. Your mammogram is due in October. Return to clinic in 1 year for follow-up appointment. Call clinic with any questions or concerns.   Thank you for choosing [place] at [facility] to provide your oncology and hematology care.  To afford each patient quality time with our provider, please arrive at least 15 minutes before your scheduled appointment time.   Beginning January 23rd 2017 lab work for the [facility] will be done in the  Main lab at [facility] on 1st floor. If you have a lab appointment with the [place] please come in thru the  Main Entrance and check in at the main information desk  You need to re-schedule your appointment should you arrive 10 or more minutes late.  We strive to give you quality time with our providers, and arriving late affects you and other patients whose appointments are after yours.  Also, if you no show three or more times for appointments you may be dismissed from the clinic at the providers discretion.     Again, thank you for choosing [facility].  Our hope is that these requests will decrease the amount of time that you wait before being seen by our physicians.       _____________________________________________________________  Should you have questions after your visit to [facility], please contact our office at [phone] between the hours of 8:30 a.m. and 4:30 p.m.  Voicemails left after 4:30 p.m. will not be returned until the following business day.  For prescription refill requests, have your pharmacy contact our office.         Resources For Cancer Patients and their Caregivers ? American Cancer Society: Can assist with transportation, wigs, general needs, runs Look  Good Feel Better.        272-627-2896 ? Cancer Care: Provides financial assistance, online support groups, medication/co-pay assistance.  1-800-813-HOPE 832-457-2889) ? [facility] Assists [place] cancer patients and their families through emotional , educational and financial support.  808 326 7406 ? [place] DSS Where to apply for food stamps, Medicaid and utility assistance. 530-071-4816 ? [facility]: Transportation to medical appointments. 217-228-2828 ? Social Security Administration: May apply for disability if have a Stage IV cancer. (308)230-2316 (724)863-7811 ? [employer], Disability and Transit Services: Assists with nutrition, care and transit needs. [place] Support Programs: @10RELATIVEDAYS @ > Cancer Support Group  2nd Tuesday of the month 1pm-2pm, Journey Room  > Creative Journey  3rd Tuesday of the month 1130am-1pm, Journey Room  > Look Good Feel Better  1st Wednesday of the month 10am-12 noon, Journey Room (Call [place] to register [phone])

## 2016-04-14 ENCOUNTER — Ambulatory Visit (HOSPITAL_COMMUNITY)
Admission: RE | Admit: 2016-04-14 | Discharge: 2016-04-14 | Disposition: A | Discharge status: Home / Self Care | Payer: Medicare Other | Source: Ambulatory Visit | Attending: Oncology | Admitting: Oncology

## 2016-04-14 DIAGNOSIS — Z1231 Encounter for screening mammogram for malignant neoplasm of breast: Secondary | ICD-10-CM

## 2016-09-30 ENCOUNTER — Other Ambulatory Visit (HOSPITAL_COMMUNITY): Payer: Self-pay | Admitting: Family Medicine

## 2016-09-30 DIAGNOSIS — R911 Solitary pulmonary nodule: Secondary | ICD-10-CM

## 2016-10-10 ENCOUNTER — Ambulatory Visit (HOSPITAL_COMMUNITY)
Admission: RE | Admit: 2016-10-10 | Discharge: 2016-10-10 | Disposition: A | Discharge status: Home / Self Care | Payer: Medicare Other | Source: Ambulatory Visit | Attending: Family Medicine | Admitting: Family Medicine

## 2016-10-10 DIAGNOSIS — I10 Essential (primary) hypertension: Secondary | ICD-10-CM | POA: Insufficient documentation

## 2016-10-10 DIAGNOSIS — R918 Other nonspecific abnormal finding of lung field: Secondary | ICD-10-CM | POA: Diagnosis not present

## 2016-10-10 DIAGNOSIS — I700 Atherosclerosis of aorta: Secondary | ICD-10-CM | POA: Insufficient documentation

## 2016-10-10 DIAGNOSIS — R911 Solitary pulmonary nodule: Secondary | ICD-10-CM | POA: Diagnosis present

## 2016-10-10 DIAGNOSIS — Z6824 Body mass index (BMI) 24.0-24.9, adult: Secondary | ICD-10-CM | POA: Insufficient documentation

## 2016-12-30 ENCOUNTER — Encounter (HOSPITAL_COMMUNITY): Payer: Self-pay | Admitting: Oncology

## 2016-12-30 ENCOUNTER — Encounter (HOSPITAL_COMMUNITY): Payer: Medicare Other | Attending: Hematology & Oncology | Admitting: Oncology

## 2016-12-30 ENCOUNTER — Ambulatory Visit (HOSPITAL_COMMUNITY): Payer: Medicare Other | Admitting: Oncology

## 2016-12-30 DIAGNOSIS — Z853 Personal history of malignant neoplasm of breast: Secondary | ICD-10-CM | POA: Diagnosis not present

## 2016-12-30 DIAGNOSIS — C50911 Malignant neoplasm of unspecified site of right female breast: Secondary | ICD-10-CM

## 2016-12-30 NOTE — Assessment & Plan Note (Addendum)
Stage I (T1 C. 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 She had her definitive surgery on 03/10/2005. This is a grade 3 cancer 1.6 cm, with one negative sentinel lymph node. No LV I was seen. She took FEC x4 cycles every 21 days followed by dose dense Taxotere navelbine for 4 cycles every 14 days finishing all therapy as of 08/21/2005. She was then treated with radiation therapy by [name] and remains free of disease.  No role for labs from an oncology perspective today.  I personally reviewed and went over radiographic studies with the patient.  The results are noted within this dictation.  I personally reviewed the images in PACS.  CT scan of chest in April 2018 demonstrated multiple pulmonary nodules that are stable.  It is recommended that the patient have repeat CT imaging in April 2019.  This is being managed by PCP.  I personally reviewed and went over radiographic studies with the patient.  The results are noted within this dictation.  I personally reviewed the images in PACS.  Mammogram in October 2017 was BIRADS 2.  She will be due for her next mammogram in October 2018.   Order is placed for mammogram in October 2018.  Return in 12 months for follow-up.  She wishes to continue to see [place] annually for her breast exams.  She is a candidate for release from [facility] with regular follow-up by primary care provider (if she wishes).

## 2016-12-30 NOTE — Progress Notes (Signed)
Michaela Sites, MD [place] [address]  Infiltrating ductal carcinoma of right breast [facility])  CURRENT THERAPY: Surveillance per NCCN guidelines  INTERVAL HISTORY: Michaela Shaffer 71 y.o. 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  She had her definitive surgery on 03/10/2005.  This was a grade 3 cancer, 1.6 cm in size, with one negative sentinel lymph node.  No LV I was seen.  She took FEC 4 cycles every 21 days followed by dose dense Taxotere/Navelbine for 4 cycles every 14 days finishing all therapy as of 08/21/2005.  He was then treated with radiation therapy by [name] and remains disease-free.    Infiltrating ductal carcinoma of breast ([place])   02/13/2005 Initial Diagnosis    Infiltrating ductal carcinoma of breast      03/10/2005 Surgery    Right lumpectomy demonstrating a 1.6 cm invasive ductal carcinoma involving the deep margin with 0/1 lymph nodes for disease.  ER 0%, PR 0%, Her2 negative, Ki-67 38%      03/19/2005 Surgery    Re-excision of deep margin showing no residula cancer      04/15/2005 - 06/19/2005 Chemotherapy    FEC x 4 cycles      07/10/2005 - 08/21/2005 Chemotherapy    Navelbine/Taxotere x 4 cycles      09/22/2005 - 11/06/2005 Radiation Therapy    [name]      09/13/2015 Imaging    CT chest performed by PCP for cough- Negative CT scan of the chest.       HPI Elements   Location: Right breast  Quality: Infiltrating ductal  Severity: Stage I  Duration: Dx in 2006  Context: Triple Negative.  Timing: S/P lumpectomy followed by chemotherapy, followed by XRT  Modifying Factors:   Associated Signs & Symptoms:     Review of Systems  Constitutional: Negative.  Negative for chills, fever and weight loss.  HENT:  Negative.   Eyes: Negative.   Respiratory: Negative.  Negative for cough.   Cardiovascular: Negative.  Negative for chest pain.  Gastrointestinal: Negative.  Negative for blood in stool, constipation, diarrhea, melena, nausea and vomiting.  Genitourinary: Negative.   Musculoskeletal: Negative.   Skin: Negative.   Neurological: Negative.  Negative for weakness.  Endo/Heme/Allergies: Negative.   Psychiatric/Behavioral: Negative.     Past Medical History:  Diagnosis Date  . Breast CA ([place]) 2006   s/p chemo,rad [name]  . Constipation   . Diabetes mellitus   . Diverticula of colon    L side  . Fecal incontinence   . Hyperlipidemia   . Hypertension   . Infiltrating ductal carcinoma of breast ([place]) 04/25/2009   Qualifier: History of  By: Zeb Comfort    . S/P colonoscopy Dec 2005, Nov 2010   2005: normal, 2010: lax sphincter tone, left-sided diverticula     Past Surgical History:  Procedure Laterality Date  . BREAST BIOPSY Right 2014  . COLONOSCOPY  05/16/2004   Dr.Rourk- normal rectum, normal colon.  . COLONOSCOPY  05/02/2009   Dr.Rourk- lax sphincter tone, o/w normal rectum, few scattered L sided diverticula  . PARTIAL HYSTERECTOMY    . portacath placement and removal    . right lumpectomy      Family History  Problem Relation Age of Onset  .  Colon cancer Neg Hx   . Diabetes Mother   . Stroke Father   . Cancer Sister     Social History   Social History  . Marital status: Married    Spouse name: N/A  . Number of children: N/A  . Years of education: N/A   Social History Main Topics  . Smoking status: Never Smoker  . Smokeless tobacco: Never Used  . Alcohol use No  . Drug use: No  . Sexual activity: Not Asked   Other Topics Concern  . None   Social History Narrative  . None     PHYSICAL EXAMINATION  ECOG PERFORMANCE STATUS: 0 - Asymptomatic  Vitals:   12/30/16 1315  BP: 133/65  Pulse: 85  Resp: 16    GENERAL:alert, no distress, well  nourished, well developed, comfortable, cooperative, smiling and unaccompanied in office visit. SKIN: skin color, texture, turgor are normal, no rashes or significant lesions HEAD: Normocephalic, No masses, lesions, tenderness or abnormalities EYES: normal, EOMI, Conjunctiva are pink and non-injected EARS: External ears normal OROPHARYNX:lips, buccal mucosa, and tongue normal and mucous membranes are moist  NECK: supple, no adenopathy, thyroid normal size, non-tender, without nodularity, trachea midline LYMPH:  no palpable lymphadenopathy, no hepatosplenomegaly BREAST:left breast normal without mass, skin or nipple changes or axillary nodes, right post-lumpectomy site well healed and free of suspicious changes LUNGS: clear to auscultation and percussion HEART: regular rate & rhythm, no murmurs, no gallops, S1 normal and S2 normal ABDOMEN:abdomen soft, non-tender, normal bowel sounds and no masses or organomegaly BACK: Back symmetric, no curvature., No CVA tenderness EXTREMITIES:less then 2 second capillary refill, no joint deformities, effusion, or inflammation, no edema, no skin discoloration, no clubbing, no cyanosis  NEURO: alert & oriented x 3 with fluent speech, no focal motor/sensory deficits, gait normal   LABORATORY DATA: CBC    Component Value Date/Time   WBC 4.1 01/16/2015 1636   RBC 4.68 01/16/2015 1636   HGB 12.2 01/16/2015 1636   HCT 35.8 (L) 01/16/2015 1636   PLT 246 01/16/2015 1636   MCV 76.5 (L) 01/16/2015 1636   MCH 26.1 01/16/2015 1636   MCHC 34.1 01/16/2015 1636   RDW 13.1 01/16/2015 1636   LYMPHSABS 1.6 01/16/2015 1636   MONOABS 0.3 01/16/2015 1636   EOSABS 0.0 01/16/2015 1636   BASOSABS 0.0 01/16/2015 1636      Chemistry      Component Value Date/Time   NA 131 (L) 01/16/2015 1636   K 3.1 (L) 01/16/2015 1636   CL 98 (L) 01/16/2015 1636   CO2 19 (L) 01/16/2015 1636   BUN 22 (H) 01/16/2015 1636   CREATININE 0.90 09/13/2015 0946      Component Value  Date/Time   CALCIUM 9.6 01/16/2015 1636   ALKPHOS 62 10/07/2013 1047   AST 16 10/07/2013 1047   ALT 18 10/07/2013 1047   BILITOT 0.2 (L) 10/07/2013 1047        PENDING LABS:   RADIOGRAPHIC STUDIES:  No results found.   PATHOLOGY:    ASSESSMENT AND PLAN:  Infiltrating ductal carcinoma of breast Stage I (T1 C. 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 She had her definitive surgery on 03/10/2005. This is a grade 3 cancer 1.6 cm, with one negative sentinel lymph node. No LV I was seen. She took FEC x4 cycles every 21 days  followed by dose dense Taxotere navelbine for 4 cycles every 14 days finishing all therapy as of 08/21/2005. She was then treated with radiation therapy by [name] and remains free of disease.  No role for labs from an oncology perspective today.  I personally reviewed and went over radiographic studies with the patient.  The results are noted within this dictation.  I personally reviewed the images in PACS.  CT scan of chest in April 2018 demonstrated multiple pulmonary nodules that are stable.  It is recommended that the patient have repeat CT imaging in April 2019.  This is being managed by PCP.  I personally reviewed and went over radiographic studies with the patient.  The results are noted within this dictation.  I personally reviewed the images in PACS.  Mammogram in October 2017 was BIRADS 2.  She will be due for her next mammogram in October 2018.   Order is placed for mammogram in October 2018.  Return in 12 months for follow-up.  She wishes to continue to see [place] annually for her breast exams.  She is a candidate for release from [facility] with regular follow-up by primary care provider (if she wishes).  ORDERS PLACED FOR THIS ENCOUNTER: No orders of the defined types were  placed in this encounter.   MEDICATIONS PRESCRIBED THIS ENCOUNTER: No orders of the defined types were placed in this encounter.   THERAPY PLAN:  NCCN guidelines recommends the following surveillance for invasive breast cancer (1.2018):  A. History and Physical exam 1-4 times per year as clinically appropriate for 5 years, then annually.  B. Periodic screening for changes in family history and referral to genetics counseling as indicated  C. Educate, monitor, and refer to lymphedema management.  D. Mammography every 12 months  E. Routine imaging of reconstructed breast is not indicated.  F. In the absence of clinical signs and symptoms suggestive of recurrent disease, there is no indication for laboratory or imaging studies for metastases screening.  G. Women on Tamoxifen: annual gynecologic assessment every 12 months if uterus is present.  H. Women on aromatase inhibitor or who experience ovarian failure secondary to treatment should have monitoring of bone health with a bone mineral density determination at baseline and periodically thereafter.  I. Assess and encourage adherence to adjuvant endocrine therapy.  J. Evidence suggests that active lifestyle, healthy diet, limited alcohol intake, and achieving and maintaining an ideal body weight (20-25 BMI) may lead to optimal breast cancer outcomes.  All questions were answered. The patient knows to call the clinic with any problems, questions or concerns. We can certainly see the patient much sooner if necessary.  Patient and plan discussed with Dr. Twana First and she is in agreement with the aforementioned.   This note is electronically signed by: Doy Mince 12/30/2016 2:03 PM

## 2016-12-30 NOTE — Patient Instructions (Signed)
[facility] at [facility] Discharge Instructions  RECOMMENDATIONS MADE BY THE CONSULTANT AND ANY TEST RESULTS WILL BE SENT TO YOUR REFERRING PHYSICIAN.  You were seen today by Tom Kefalas PA-C. Your mammogram is due in October of this year. Return in 12 months for follow up.    Thank you for choosing [facility] at [facility] to provide your oncology and hematology care.  To afford each patient quality time with our provider, please arrive at least 15 minutes before your scheduled appointment time.    If you have a lab appointment with the Cancer Center please come in thru the  Main Entrance and check in at the main information desk  You need to re-schedule your appointment should you arrive 10 or more minutes late.  We strive to give you quality time with our providers, and arriving late affects you and other patients whose appointments are after yours.  Also, if you no show three or more times for appointments you may be dismissed from the clinic at the providers discretion.     Again, thank you for choosing [facility].  Our hope is that these requests will decrease the amount of time that you wait before being seen by our physicians.       _____________________________________________________________  Should you have questions after your visit to [facility], please contact our office at [phone] between the hours of 8:30 a.m. and 4:30 p.m.  Voicemails left after 4:30 p.m. will not be returned until the following business day.  For prescription refill requests, have your pharmacy contact our office.       Resources For Cancer Patients and their Caregivers ? American Cancer Society: Can assist with transportation, wigs, general needs, runs Look Good Feel Better.        1-888-227-6333 ? Cancer Care: Provides financial assistance, online support groups, medication/co-pay assistance.  1-800-813-HOPE  (4673) ? [facility] Assists [place] cancer patients and their families through emotional , educational and financial support.  336-427-4357 ? [place] DSS Where to apply for food stamps, Medicaid and utility assistance. 336-342-1394 ? RCATS: Transportation to medical appointments. 336-347-2287 ? Social Security Administration: May apply for disability if have a Stage IV cancer. 336-342-7796 1-800-772-1213 ? [place] Aging, Disability and Transit Services: Assists with nutrition, care and transit needs. 336-349-2343  Cancer Center Support Programs: [email or web address]@ > Cancer Support Group  2nd Tuesday of the month 1pm-2pm, Journey Room  > Creative Journey  3rd Tuesday of the month 1130am-1pm, Journey Room  > Look Good Feel Better  1st Wednesday of the month 10am-12 noon, Journey Room (Call American Cancer Society to register 1-800-395-5775)    

## 2017-04-20 ENCOUNTER — Encounter (HOSPITAL_COMMUNITY): Payer: Self-pay

## 2017-04-20 ENCOUNTER — Ambulatory Visit (HOSPITAL_COMMUNITY)
Admission: RE | Admit: 2017-04-20 | Discharge: 2017-04-20 | Disposition: A | Discharge status: Home / Self Care | Payer: Medicare Other | Source: Ambulatory Visit | Attending: Oncology | Admitting: Oncology

## 2017-04-20 DIAGNOSIS — Z1231 Encounter for screening mammogram for malignant neoplasm of breast: Secondary | ICD-10-CM | POA: Diagnosis not present

## 2017-04-20 DIAGNOSIS — C50911 Malignant neoplasm of unspecified site of right female breast: Secondary | ICD-10-CM

## 2017-09-23 ENCOUNTER — Other Ambulatory Visit (HOSPITAL_COMMUNITY): Payer: Self-pay | Admitting: Family Medicine

## 2017-09-23 DIAGNOSIS — R911 Solitary pulmonary nodule: Secondary | ICD-10-CM

## 2017-10-08 ENCOUNTER — Ambulatory Visit (HOSPITAL_COMMUNITY)
Admission: RE | Admit: 2017-10-08 | Discharge: 2017-10-08 | Disposition: A | Discharge status: Home / Self Care | Payer: Medicare Other | Source: Ambulatory Visit | Attending: Family Medicine | Admitting: Family Medicine

## 2017-10-08 DIAGNOSIS — I700 Atherosclerosis of aorta: Secondary | ICD-10-CM | POA: Insufficient documentation

## 2017-10-08 DIAGNOSIS — R911 Solitary pulmonary nodule: Secondary | ICD-10-CM | POA: Diagnosis present

## 2017-10-08 DIAGNOSIS — R918 Other nonspecific abnormal finding of lung field: Secondary | ICD-10-CM | POA: Diagnosis not present

## 2017-12-30 ENCOUNTER — Inpatient Hospital Stay (HOSPITAL_COMMUNITY): Payer: Medicare Other | Attending: Internal Medicine | Admitting: Internal Medicine

## 2017-12-30 ENCOUNTER — Encounter (HOSPITAL_COMMUNITY): Payer: Self-pay | Admitting: Internal Medicine

## 2017-12-30 ENCOUNTER — Other Ambulatory Visit (HOSPITAL_COMMUNITY): Payer: Self-pay | Admitting: Internal Medicine

## 2017-12-30 VITALS — BP 142/67 | HR 88 | Temp 97.7°F | Resp 14 | Wt 159.8 lb

## 2017-12-30 DIAGNOSIS — Z923 Personal history of irradiation: Secondary | ICD-10-CM | POA: Insufficient documentation

## 2017-12-30 DIAGNOSIS — Z9011 Acquired absence of right breast and nipple: Secondary | ICD-10-CM | POA: Diagnosis not present

## 2017-12-30 DIAGNOSIS — I10 Essential (primary) hypertension: Secondary | ICD-10-CM | POA: Insufficient documentation

## 2017-12-30 DIAGNOSIS — C50911 Malignant neoplasm of unspecified site of right female breast: Secondary | ICD-10-CM | POA: Diagnosis present

## 2017-12-30 DIAGNOSIS — Z9221 Personal history of antineoplastic chemotherapy: Secondary | ICD-10-CM | POA: Insufficient documentation

## 2017-12-30 DIAGNOSIS — K5900 Constipation, unspecified: Secondary | ICD-10-CM

## 2017-12-30 DIAGNOSIS — Z171 Estrogen receptor negative status [ER-]: Secondary | ICD-10-CM | POA: Diagnosis not present

## 2017-12-30 DIAGNOSIS — Z800 Family history of malignant neoplasm of digestive organs: Secondary | ICD-10-CM

## 2017-12-30 DIAGNOSIS — R918 Other nonspecific abnormal finding of lung field: Secondary | ICD-10-CM | POA: Diagnosis not present

## 2017-12-30 DIAGNOSIS — Z1231 Encounter for screening mammogram for malignant neoplasm of breast: Secondary | ICD-10-CM

## 2017-12-30 MED ORDER — MISC. DEVICES MISC: 0 refills | Status: DC

## 2017-12-30 NOTE — Progress Notes (Signed)
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 Devices MISC, Misc. Devices MISC  Staging Cancer Staging Infiltrating ductal carcinoma of breast ([place]) Staging form: Breast, AJCC 7th Edition - Clinical: Stage IA (T1c, N0, cM0) - Signed by Baird [facility], PA-C on 10/06/2013   Assessment and Plan:  1. Stage I (T1 C. 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 She had her definitive surgery on 03/10/2005. This is a grade 3 cancer 1.6 cm, with one negative sentinel lymph node. No LV I was seen. She took FEC x4 cycles every 21 days followed by dose dense Taxotere navelbine for 4 cycles every 14 days finishing all therapy as of 08/21/2005. She was then treated with radiation therapy by [name].    Pt is seen today for follow-up.  She had 3D screening mammogram done 04/2017 that was reviewed and is negative.  She is recommended for repeat imaging in 04/2018 and this is set up.  She will be placed on schedule to be seen in November 2020 with mammogram results.  She is advised to notify the office if she has any problems prior to her next visit.    2.  HTN. BP is 142/67.  Follow-up with PCP.    3.  Pulmonary nodules.  Pt had CT scan done 10/08/2017 that was reviewed and showed  IMPRESSION: Multiple stable bilateral pulmonary nodules and ground-glass densities since 01/16/2015. No further follow-up is recommended at this time.  Pt is a non-smoker and is not recommended for additional imaging unless symptoms change.    4.  Constipation.  Pt has been seen by GI and reportedly given stool cards.  Option for OTC stool softeners.  Follow-up with GI as recommended.    5.  Health maintenance.  She reports she was given  stool cards by Dr. Gala Romney.  She should follow-up with GI as recommended.    Greater than 35 minutes spent with more than 50% spent in counseling and coordination of care.    Interval history:  Historical data obtained from the note dated 12/30/2016.  Pt is a 72 yr old female followed at [facility] for Stage I (T1 C. 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 She had her definitive surgery on 03/10/2005. This is a grade 3 cancer 1.6 cm, with one negative sentinel lymph node. No LV I was seen. She took FEC x4 cycles every 21 days followed by dose dense Taxotere navelbine for 4 cycles every 14 days finishing all therapy as of 08/21/2005. She was then treated with radiation therapy by [name].  Pt has been being seen yearly at [facility].     Current Status:  Pt is seen today for follow-up.  She had recent CT scan.  She denies any breast complaints.       Infiltrating ductal carcinoma of breast ([place])   02/13/2005 Initial Diagnosis    Infiltrating ductal carcinoma of breast      03/10/2005 Surgery    Right lumpectomy demonstrating a 1.6 cm invasive ductal carcinoma involving the deep margin with 0/1 lymph nodes for disease.  ER 0%, PR 0%,  Her2 negative, Ki-67 38%      03/19/2005 Surgery    Re-excision of deep margin showing no residula cancer      04/15/2005 - 06/19/2005 Chemotherapy    FEC x 4 cycles      07/10/2005 - 08/21/2005 Chemotherapy    Navelbine/Taxotere x 4 cycles      09/22/2005 - 11/06/2005 Radiation Therapy    [name]      09/13/2015 Imaging    CT chest performed by PCP for cough- Negative CT scan of the chest.        Problem List Patient Active Problem List   Diagnosis Date Noted  . Disorder of thyroid gland [E07.9] 04/27/2015  . Diarrhea [R19.7] 01/22/2011  . HEMATOCHEZIA [K92.1] 04/26/2009  .  Infiltrating ductal carcinoma of breast ([place]) [C50.919] 04/25/2009  . DM [E11.9] 04/25/2009  . HYPERLIPIDEMIA [E78.5] 04/25/2009  . Essential hypertension [I10] 04/25/2009  . FECAL INCONTINENCE [787.6] 04/25/2009  . CONSTIPATION, HX OF [Z87.19] 04/25/2009    Past Medical History Past Medical History:  Diagnosis Date  . Breast CA ([place]) 2006   s/p chemo,rad [name]  . Constipation   . Diabetes mellitus   . Diverticula of colon    L side  . Fecal incontinence   . Hyperlipidemia   . Hypertension   . Infiltrating ductal carcinoma of breast ([place]) 04/25/2009   Qualifier: History of  By: Zeb Comfort    . S/P colonoscopy Dec 2005, Nov 2010   2005: normal, 2010: lax sphincter tone, left-sided diverticula     Past Surgical History Past Surgical History:  Procedure Laterality Date  . BREAST BIOPSY Right 2014  . COLONOSCOPY  05/16/2004   Dr.Rourk- normal rectum, normal colon.  . COLONOSCOPY  05/02/2009   Dr.Rourk- lax sphincter tone, o/w normal rectum, few scattered L sided diverticula  . PARTIAL HYSTERECTOMY    . portacath placement and removal    . right lumpectomy      Family History Family History  Problem Relation Age of Onset  . Diabetes Mother   . Stroke Father   . Cancer Sister   . Colon cancer Neg Hx      Social History  reports that she has never smoked. She has never used smokeless tobacco. She reports that she does not drink alcohol or use drugs.  Medications  Current Outpatient Medications:  .  aspirin 81 MG tablet, Take 81 mg by mouth daily.  , Disp: , Rfl:  .  Calcium Carbonate-Vitamin D (CALCIUM + D) 600-200 MG-UNIT TABS, Take 600 mg/day by mouth daily. , Disp: , Rfl:  .  losartan-hydrochlorothiazide (HYZAAR) 100-25 MG per tablet, Take 1 tablet by mouth daily., Disp: , Rfl:  .  metFORMIN (GLUCOPHAGE) 500 MG tablet, Take 500 mg by mouth 2 (two) times daily with a meal., Disp: , Rfl:  .  Misc. Devices MISC, Please provide patient with mastectomy  prothesis., Disp: 2 each, Rfl: 0 .  Misc. Devices MISC, Please provide patient with mastectomy bras., Disp: 7 each, Rfl: 0 .  simvastatin (ZOCOR) 40 MG tablet, Take 40 mg by mouth every evening. , Disp: , Rfl:  .  verapamil (CALAN-SR) 240 MG CR tablet, Take 240 mg by mouth at bedtime.  , Disp: , Rfl:   Allergies Penicillins  Review of Systems Review of Systems - Oncology ROS negative other than constipation   Physical Exam  Vitals Wt Readings from Last 3 Encounters:  12/30/17 159 lb 12.8 oz (72.5 kg)  12/30/16 157 lb (  71.2 kg)  12/26/15 155 lb 4.8 oz (70.4 kg)   Temp Readings from Last 3 Encounters:  12/30/17 97.7 F (36.5 C) (Oral)  12/26/15 97.7 F (36.5 C) (Oral)  01/16/15 97.8 F (36.6 C) (Oral)   BP Readings from Last 3 Encounters:  12/30/17 (!) 142/67  12/30/16 133/65  12/26/15 133/62   Pulse Readings from Last 3 Encounters:  12/30/17 88  12/30/16 85  12/26/15 76   Constitutional: Well-developed, well-nourished, and in no distress.   HENT: Head: Normocephalic and atraumatic.  Mouth/Throat: No oropharyngeal exudate. Mucosa moist. Eyes: Pupils are equal, round, and reactive to light. Conjunctivae are normal. No scleral icterus.  Neck: Normal range of motion. Neck supple. No JVD present.  Cardiovascular: Normal rate, regular rhythm and normal heart sounds.  Exam reveals no gallop and no friction rub.   No murmur heard. Pulmonary/Chest: Effort normal and breath sounds normal. No respiratory distress. No wheezes.No rales.  Abdominal: Soft. Bowel sounds are normal. No distension. There is no tenderness. There is no guarding.  Musculoskeletal: No edema or tenderness.  Lymphadenopathy: No cervical, axillary or supraclavicular adenopathy.  Neurological: Alert and oriented to person, place, and time. No cranial nerve deficit.  Skin: Skin is warm and dry. No rash noted. No erythema. No pallor.  Psychiatric: Affect and judgment normal.  Bilateral breast exam:  Chaperone  present.  Right lumpectomy changes noted.  No palpable dominant masses noted bilaterally.    Labs No visits with results within 3 Day(s) from this visit.  Latest known visit with results is:  Hospital Outpatient Visit on 09/13/2015  Component Date Value Ref Range Status  . Creatinine, Ser 09/13/2015 0.90  0.44 - 1.00 mg/dL Final     Pathology Orders Placed This Encounter  Procedures  . MM Digital Screening    Standing Status:   Future    Standing Expiration Date:   12/30/2018    Order Specific Question:   Reason for Exam (SYMPTOM  OR DIAGNOSIS REQUIRED)    Answer:   right breast cancer    Order Specific Question:   Preferred imaging location?    Answer:   [facility]  . MM Digital Screening    Standing Status:   Future    Standing Expiration Date:   12/31/2019    Order Specific Question:   Reason for Exam (SYMPTOM  OR DIAGNOSIS REQUIRED)    Answer:   right breast cancer    Order Specific Question:   Preferred imaging location?    Answer:   [facility]       Zoila Shutter MD

## 2017-12-30 NOTE — Patient Instructions (Signed)
[facility] at [facility] Discharge Instructions  You saw Dr. Higgs today.   Thank you for choosing [facility] at [facility] to provide your oncology and hematology care.  To afford each patient quality time with our provider, please arrive at least 15 minutes before your scheduled appointment time.   If you have a lab appointment with the Cancer Center please come in thru the  Main Entrance and check in at the main information desk  You need to re-schedule your appointment should you arrive 10 or more minutes late.  We strive to give you quality time with our providers, and arriving late affects you and other patients whose appointments are after yours.  Also, if you no show three or more times for appointments you may be dismissed from the clinic at the providers discretion.     Again, thank you for choosing [facility].  Our hope is that these requests will decrease the amount of time that you wait before being seen by our physicians.       _____________________________________________________________  Should you have questions after your visit to [facility], please contact our office at [phone] between the hours of 8:30 a.m. and 4:30 p.m.  Voicemails left after 4:30 p.m. will not be returned until the following business day.  For prescription refill requests, have your pharmacy contact our office.       Resources For Cancer Patients and their Caregivers ? American Cancer Society: Can assist with transportation, wigs, general needs, runs Look Good Feel Better.        1-888-227-6333 ? Cancer Care: Provides financial assistance, online support groups, medication/co-pay assistance.  1-800-813-HOPE (4673) ? [facility] Assists [place] cancer patients and their families through emotional , educational and financial support.  336-427-4357 ? [place] DSS Where to apply for food  stamps, Medicaid and utility assistance. 336-342-1394 ? RCATS: Transportation to medical appointments. 336-347-2287 ? Social Security Administration: May apply for disability if have a Stage IV cancer. 336-342-7796 1-800-772-1213 ? [place] Aging, Disability and Transit Services: Assists with nutrition, care and transit needs. 336-349-2343  Cancer Center Support Programs:   > Cancer Support Group  2nd Tuesday of the month 1pm-2pm, Journey Room   > Creative Journey  3rd Tuesday of the month 1130am-1pm, Journey Room     

## 2018-04-22 ENCOUNTER — Ambulatory Visit (HOSPITAL_COMMUNITY)
Admission: RE | Admit: 2018-04-22 | Discharge: 2018-04-22 | Disposition: A | Discharge status: Home / Self Care | Payer: Medicare Other | Source: Ambulatory Visit | Attending: Internal Medicine | Admitting: Internal Medicine

## 2018-04-22 ENCOUNTER — Other Ambulatory Visit (HOSPITAL_COMMUNITY): Payer: Self-pay | Admitting: Internal Medicine

## 2018-04-22 ENCOUNTER — Encounter (HOSPITAL_COMMUNITY): Payer: Self-pay

## 2018-04-22 DIAGNOSIS — Z1231 Encounter for screening mammogram for malignant neoplasm of breast: Secondary | ICD-10-CM | POA: Insufficient documentation

## 2018-04-22 DIAGNOSIS — Z853 Personal history of malignant neoplasm of breast: Secondary | ICD-10-CM | POA: Diagnosis not present

## 2018-04-22 DIAGNOSIS — C50911 Malignant neoplasm of unspecified site of right female breast: Secondary | ICD-10-CM

## 2018-04-22 DIAGNOSIS — R928 Other abnormal and inconclusive findings on diagnostic imaging of breast: Secondary | ICD-10-CM | POA: Diagnosis not present

## 2018-04-23 ENCOUNTER — Telehealth (HOSPITAL_COMMUNITY): Payer: Self-pay | Admitting: Emergency Medicine

## 2018-04-23 ENCOUNTER — Other Ambulatory Visit (HOSPITAL_COMMUNITY): Payer: Self-pay | Admitting: Internal Medicine

## 2018-04-23 DIAGNOSIS — C50911 Malignant neoplasm of unspecified site of right female breast: Secondary | ICD-10-CM

## 2018-04-23 NOTE — Telephone Encounter (Signed)
Called patient Per Dr.Higgs request. Explained to her that there is a possible mass in her left breast and a ultrasound will need to be completed to confirm this. Order was placed in epic for ultrasound and patient made aware that radiology will be contacting her in regards to this. She verbalized understanding of this.

## 2018-04-26 ENCOUNTER — Other Ambulatory Visit (HOSPITAL_COMMUNITY): Payer: Self-pay | Admitting: Internal Medicine

## 2018-04-26 DIAGNOSIS — R928 Other abnormal and inconclusive findings on diagnostic imaging of breast: Secondary | ICD-10-CM

## 2018-05-04 ENCOUNTER — Ambulatory Visit (HOSPITAL_COMMUNITY)
Admission: RE | Admit: 2018-05-04 | Discharge: 2018-05-04 | Disposition: A | Discharge status: Home / Self Care | Payer: Medicare Other | Source: Ambulatory Visit | Attending: Internal Medicine | Admitting: Internal Medicine

## 2018-05-04 DIAGNOSIS — N6002 Solitary cyst of left breast: Secondary | ICD-10-CM | POA: Insufficient documentation

## 2018-05-04 DIAGNOSIS — C50911 Malignant neoplasm of unspecified site of right female breast: Secondary | ICD-10-CM | POA: Diagnosis present

## 2018-05-06 ENCOUNTER — Telehealth (HOSPITAL_COMMUNITY): Payer: Self-pay | Admitting: *Deleted

## 2018-05-06 NOTE — Telephone Encounter (Signed)
Pt aware that area is a cyst and no malignancy. Pt advised to continue yearly mammograms. Pt verbalized understanding.

## 2018-07-20 ENCOUNTER — Encounter: Payer: Self-pay | Admitting: Internal Medicine

## 2018-09-21 ENCOUNTER — Ambulatory Visit: Payer: Medicare Other | Admitting: Gastroenterology

## 2018-12-08 ENCOUNTER — Telehealth: Payer: Self-pay

## 2018-12-08 ENCOUNTER — Encounter: Payer: Self-pay | Admitting: Gastroenterology

## 2018-12-08 ENCOUNTER — Ambulatory Visit: Payer: Medicare Other | Admitting: Gastroenterology

## 2018-12-08 ENCOUNTER — Other Ambulatory Visit: Payer: Self-pay

## 2018-12-08 VITALS — BP 120/73 | HR 79 | Temp 97.0°F | Ht 67.0 in | Wt 161.6 lb

## 2018-12-08 DIAGNOSIS — Z1211 Encounter for screening for malignant neoplasm of colon: Secondary | ICD-10-CM

## 2018-12-08 DIAGNOSIS — K5900 Constipation, unspecified: Secondary | ICD-10-CM | POA: Insufficient documentation

## 2018-12-08 MED ORDER — PEG 3350-KCL-NA BICARB-NACL 420 G PO SOLR: 4000.0000 mL | ORAL | 0 refills | Status: DC

## 2018-12-08 NOTE — Assessment & Plan Note (Signed)
BM every few days, improvement historically years ago with supplemental fiber. Occasional soft fecal seepage. Add Benefiber daily. Call if no improvement. No alarm signs/symptoms. Known history of lax sphincter tone.

## 2018-12-08 NOTE — Progress Notes (Signed)
CC'D TO PCP °

## 2018-12-08 NOTE — Assessment & Plan Note (Signed)
73 year old female with need for routine screening colonoscopy, with last in 2010. No history of polyps. Denies any family history of colorectal cancer or polyps. No alarm signs/symptoms.  Proceed with TCS with Dr. Gala Romney in near future: the risks, benefits, and alternatives have been discussed with the patient in detail. The patient states understanding and desires to proceed.

## 2018-12-08 NOTE — Patient Instructions (Signed)
Start taking Benefiber daily. This is over the counter. You can increase from 1 teaspoon up to 3 per day. Hopefully, you will not need to use Miralax. Let me know how this works for you!  We have scheduled a colonoscopy in the near future with Dr. Gala Romney for routine screening purposes!  It was a pleasure to see you today. I strive to create trusting relationships with patients to provide genuine, compassionate, and quality care. I value your feedback. If you receive a survey regarding your visit,  I greatly appreciate you taking time to fill this out.   [name], PhD, ANP-BC [facility]

## 2018-12-08 NOTE — Progress Notes (Signed)
Primary Care Physician:  Sharilyn Sites, MD Primary Gastroenterologist:  Dr. Gala Romney   Chief Complaint  Patient presents with   Colonoscopy    BM slips out of her after using bathroom,hemorrhoids    HPI:   Michaela Shaffer is a 73 y.o. female presenting today at the request of Dr. Hilma Favors for colonoscopy. She was brought in due to reports of constipation from primary care notes. Last seen in 2013 with history of fecal seepage. This historically was improved with fiber supplement and probiotic. Known lax sphincter tone. Last colonoscopy in 2010 with diverticula. Due for routine screening now.   States Bristol stool scale #5. Stopped straining due to concern for hemorrhoids. Not sitting on toilet for prolonged time. BM every 2 days. Sometimes fecal seepage and wears pads. Not drinking enough water. No rectal bleeding. No abdominal pain. Good appetite. No dysphagia. Occasional indigestion if eating and laying down. Taking stool softener and miralax. Does not need to take Miralax often. Eats prunes and drinks prune juice.   Past Medical History:  Diagnosis Date   Breast CA [facility]) 2006   s/p chemo,rad [name]   Constipation    Diabetes mellitus    Diverticula of colon    L side   Fecal incontinence    Hyperlipidemia    Hypertension    Infiltrating ductal carcinoma of breast ([place]) 04/25/2009   Qualifier: History of  By: Zeb Comfort     S/P colonoscopy Dec 2005, Nov 2010   2005: normal, 2010: lax sphincter tone, left-sided diverticula     Past Surgical History:  Procedure Laterality Date   BREAST BIOPSY Right 2014   COLONOSCOPY  05/16/2004   Dr.Rourk- normal rectum, normal colon.   COLONOSCOPY  05/02/2009   Dr.Rourk- lax sphincter tone, o/w normal rectum, few scattered L sided diverticula   PARTIAL HYSTERECTOMY     portacath placement and removal     right lumpectomy      Current Outpatient Medications  Medication Sig Dispense Refill   aspirin 81 MG tablet  Take 81 mg by mouth daily.       Calcium Carbonate-Vitamin D (CALCIUM + D) 600-200 MG-UNIT TABS Take 600 mg/day by mouth daily.      losartan-hydrochlorothiazide (HYZAAR) 100-25 MG per tablet Take 1 tablet by mouth daily.     metFORMIN (GLUCOPHAGE) 500 MG tablet Take 500 mg by mouth 2 (two) times daily with a meal.     Misc. Devices MISC Please provide patient with mastectomy prothesis. 2 each 0   Misc. Devices MISC Please provide patient with mastectomy bras. 7 each 0   simvastatin (ZOCOR) 40 MG tablet Take 40 mg by mouth every evening.      verapamil (CALAN-SR) 240 MG CR tablet Take 240 mg by mouth at bedtime.       No current facility-administered medications for this visit.     Allergies as of 12/08/2018 - Review Complete 12/08/2018  Allergen Reaction Noted   Penicillins      Family History  Problem Relation Age of Onset   Diabetes Mother    Stroke Father    Cancer Sister    Colon cancer Neg Hx    Colon polyps Neg Hx     Social History   Socioeconomic History   Marital status: Married    Spouse name: Not on file   Number of children: Not on file   Years of education: Not on file   Highest education level: Not on file  Occupational History   Occupation: retired  Scientist, product/process development strain: Not on [employer] insecurity    Worry: Not on file    Inability: Not on Lexicographer needs    Medical: Not on file    Non-medical: Not on file  Tobacco Use   Smoking status: Never Smoker   Smokeless tobacco: Never Used  Substance and Sexual Activity   Alcohol use: No   Drug use: No   Sexual activity: Not on file  Lifestyle   Physical activity    Days per week: Not on file    Minutes per session: Not on file   Stress: Not on file  Relationships   Social connections    Talks on phone: Not on file    Gets together: Not on file    Attends religious service: Not on file    Active member of club or organization: Not on  file    Attends meetings of clubs or organizations: Not on file    Relationship status: Not on file   Intimate partner violence    Fear of current or ex partner: Not on file    Emotionally abused: Not on file    Physically abused: Not on file    Forced sexual activity: Not on file  Other Topics Concern   Not on file  Social History Narrative   Not on file    Review of Systems: Gen: Denies any fever, chills, fatigue, weight loss, lack of appetite.  CV: Denies chest pain, heart palpitations, peripheral edema, syncope.  Resp: Denies shortness of breath at rest or with exertion. Denies wheezing or cough.  GI: see HPI GU : Denies urinary burning, urinary frequency, urinary hesitancy MS: Denies joint pain, muscle weakness, cramps, or limitation of movement.  Derm: Denies rash, itching, dry skin Psych: Denies depression, anxiety, memory loss, and confusion Heme: Denies bruising, bleeding, and enlarged lymph nodes.  Physical Exam: BP 120/73    Pulse 79    Temp (!) 97 F (36.1 C)    Ht 5\' 7"  (1.702 m)    Wt 161 lb 9.6 oz (73.3 kg)    BMI 25.31 kg/m  General:   Alert and oriented. Pleasant and cooperative. Well-nourished and well-developed.  Head:  Normocephalic and atraumatic. Eyes:  Without icterus, sclera clear and conjunctiva pink.  Ears:  Normal auditory acuity. Nose:  No deformity, discharge,  or lesions. Mouth:  No deformity or lesions, oral mucosa pink.  Lungs:  Clear to auscultation bilaterally. No wheezes, rales, or rhonchi. No distress.  Heart:  S1, S2 present without murmurs appreciated.  Abdomen:  +BS, soft, non-tender and non-distended. No HSM noted. No guarding or rebound. No masses appreciated.  Rectal:  Deferred  Msk:  Symmetrical without gross deformities. Normal posture. Extremities:  Without edema. Neurologic:  Alert and  oriented x4;  grossly normal neurologically. Psych:  Alert and cooperative. Normal mood and affect.

## 2018-12-08 NOTE — Telephone Encounter (Signed)
TCS w/RMR scheduled during OV for 02/02/19 at 10:45am. Orders entered. Rx for prep sent to pharmacy. AB advised for pt to hold Metformin morning of procedure. Instructions mailed to pt.

## 2018-12-30 ENCOUNTER — Telehealth: Payer: Self-pay | Admitting: *Deleted

## 2018-12-30 NOTE — Telephone Encounter (Signed)
Called patient and LMTCB with son

## 2018-12-31 NOTE — Telephone Encounter (Signed)
Pt called office, COVID test scheduled for 01/31/19 at 9:15am. Pt aware to quarantine at home after test until her procedure. Pt wrote down appt.

## 2019-01-31 ENCOUNTER — Other Ambulatory Visit: Payer: Self-pay

## 2019-01-31 ENCOUNTER — Other Ambulatory Visit (HOSPITAL_COMMUNITY)
Admission: RE | Admit: 2019-01-31 | Discharge: 2019-01-31 | Disposition: A | Discharge status: Home / Self Care | Payer: Medicare Other | Source: Ambulatory Visit | Attending: Internal Medicine | Admitting: Internal Medicine

## 2019-01-31 DIAGNOSIS — Z20828 Contact with and (suspected) exposure to other viral communicable diseases: Secondary | ICD-10-CM | POA: Diagnosis not present

## 2019-01-31 DIAGNOSIS — Z01812 Encounter for preprocedural laboratory examination: Secondary | ICD-10-CM | POA: Diagnosis not present

## 2019-01-31 LAB — SARS CORONAVIRUS 2 (TAT 6-24 HRS): SARS Coronavirus 2: NEGATIVE

## 2019-02-02 ENCOUNTER — Ambulatory Visit (HOSPITAL_COMMUNITY)
Admission: RE | Admit: 2019-02-02 | Discharge: 2019-02-02 | Disposition: A | Discharge status: Home / Self Care | Payer: Medicare Other | Source: Ambulatory Visit | Attending: Internal Medicine | Admitting: Internal Medicine

## 2019-02-02 ENCOUNTER — Encounter (HOSPITAL_COMMUNITY): Payer: Self-pay | Admitting: *Deleted

## 2019-02-02 ENCOUNTER — Other Ambulatory Visit: Payer: Self-pay

## 2019-02-02 ENCOUNTER — Encounter (HOSPITAL_COMMUNITY)
Admission: RE | Disposition: A | Discharge status: Home / Self Care | Payer: Self-pay | Source: Ambulatory Visit | Attending: Internal Medicine

## 2019-02-02 DIAGNOSIS — Z880 Allergy status to penicillin: Secondary | ICD-10-CM | POA: Diagnosis not present

## 2019-02-02 DIAGNOSIS — E119 Type 2 diabetes mellitus without complications: Secondary | ICD-10-CM | POA: Insufficient documentation

## 2019-02-02 DIAGNOSIS — D120 Benign neoplasm of cecum: Secondary | ICD-10-CM | POA: Diagnosis not present

## 2019-02-02 DIAGNOSIS — Z7982 Long term (current) use of aspirin: Secondary | ICD-10-CM | POA: Diagnosis not present

## 2019-02-02 DIAGNOSIS — Z833 Family history of diabetes mellitus: Secondary | ICD-10-CM | POA: Diagnosis not present

## 2019-02-02 DIAGNOSIS — Z79899 Other long term (current) drug therapy: Secondary | ICD-10-CM | POA: Diagnosis not present

## 2019-02-02 DIAGNOSIS — I10 Essential (primary) hypertension: Secondary | ICD-10-CM | POA: Insufficient documentation

## 2019-02-02 DIAGNOSIS — K5730 Diverticulosis of large intestine without perforation or abscess without bleeding: Secondary | ICD-10-CM | POA: Insufficient documentation

## 2019-02-02 DIAGNOSIS — Z823 Family history of stroke: Secondary | ICD-10-CM | POA: Insufficient documentation

## 2019-02-02 DIAGNOSIS — Z853 Personal history of malignant neoplasm of breast: Secondary | ICD-10-CM | POA: Insufficient documentation

## 2019-02-02 DIAGNOSIS — Z7984 Long term (current) use of oral hypoglycemic drugs: Secondary | ICD-10-CM | POA: Diagnosis not present

## 2019-02-02 DIAGNOSIS — Z1211 Encounter for screening for malignant neoplasm of colon: Secondary | ICD-10-CM | POA: Insufficient documentation

## 2019-02-02 DIAGNOSIS — K635 Polyp of colon: Secondary | ICD-10-CM

## 2019-02-02 DIAGNOSIS — Z809 Family history of malignant neoplasm, unspecified: Secondary | ICD-10-CM | POA: Insufficient documentation

## 2019-02-02 DIAGNOSIS — E785 Hyperlipidemia, unspecified: Secondary | ICD-10-CM | POA: Insufficient documentation

## 2019-02-02 DIAGNOSIS — Z90710 Acquired absence of both cervix and uterus: Secondary | ICD-10-CM | POA: Diagnosis not present

## 2019-02-02 HISTORY — PX: COLONOSCOPY: SHX5424

## 2019-02-02 HISTORY — PX: POLYPECTOMY: SHX5525

## 2019-02-02 LAB — GLUCOSE, CAPILLARY: Glucose-Capillary: 104 mg/dL — ABNORMAL HIGH (ref 70–99)

## 2019-02-02 SURGERY — COLONOSCOPY
Anesthesia: Moderate Sedation

## 2019-02-02 MED ORDER — ONDANSETRON HCL 4 MG/2ML IJ SOLN
INTRAMUSCULAR | Status: DC | PRN
  Administered 2019-02-02: 4 mg via INTRAVENOUS

## 2019-02-02 MED ORDER — MIDAZOLAM HCL 5 MG/5ML IJ SOLN
INTRAMUSCULAR | Status: AC
  Filled 2019-02-02: qty 10

## 2019-02-02 MED ORDER — ONDANSETRON HCL 4 MG/2ML IJ SOLN
INTRAMUSCULAR | Status: AC
  Filled 2019-02-02: qty 2

## 2019-02-02 MED ORDER — SODIUM CHLORIDE 0.9 % IV SOLN
INTRAVENOUS | Status: DC
  Administered 2019-02-02: 10:00:00 via INTRAVENOUS

## 2019-02-02 MED ORDER — MEPERIDINE HCL 100 MG/ML IJ SOLN
INTRAMUSCULAR | Status: DC | PRN
  Administered 2019-02-02: 25 mg via INTRAVENOUS

## 2019-02-02 MED ORDER — STERILE WATER FOR IRRIGATION IR SOLN
Status: DC | PRN
  Administered 2019-02-02: 1.5 mL

## 2019-02-02 MED ORDER — MEPERIDINE HCL 50 MG/ML IJ SOLN
INTRAMUSCULAR | Status: AC
  Filled 2019-02-02: qty 1

## 2019-02-02 MED ORDER — MIDAZOLAM HCL 5 MG/5ML IJ SOLN
INTRAMUSCULAR | Status: DC | PRN
  Administered 2019-02-02 (×2): 1 mg via INTRAVENOUS
  Administered 2019-02-02: 2 mg via INTRAVENOUS

## 2019-02-02 NOTE — H&P (Signed)
@LOGO @   Primary Care Physician:  Sharilyn Sites, MD Primary Gastroenterologist:  Dr. Gala Romney  Pre-Procedure History & Physical: HPI:  Michaela Shaffer is a 73 y.o. female here for Average risk colonoscopy.  Some constipation recently treated with Benefiber. Past Medical History:  Diagnosis Date  . Breast CA ([place]) 2006   s/p chemo,rad [name]  . Constipation   . Diabetes mellitus   . Diverticula of colon    L side  . Fecal incontinence   . Hyperlipidemia   . Hypertension   . 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 S/P colonoscopy Dec 2005, Nov 2010   2005: normal, 2010: lax sphincter tone, left-sided diverticula     Past Surgical History:  Procedure Laterality Date  . BREAST BIOPSY Right 2014  . COLONOSCOPY  05/16/2004   Dr.Makya Phillis- normal rectum, normal colon.  . COLONOSCOPY  05/02/2009   Dr.Jood Retana- lax sphincter tone, o/w normal rectum, few scattered L sided diverticula  . PARTIAL HYSTERECTOMY    . portacath placement and removal    . right lumpectomy      Prior to Admission medications   Medication Sig Start Date End Date Taking? Authorizing Provider  aspirin 81 MG tablet Take 81 mg by mouth daily.     Yes [provider]  Calcium Carbonate-Vitamin D (CALCIUM + D) 600-200 MG-UNIT TABS Take 1 tablet by mouth daily.    Yes [provider]  losartan-hydrochlorothiazide (HYZAAR) 100-25 MG per tablet Take 1 tablet by mouth daily.   Yes [provider]  metFORMIN (GLUCOPHAGE) 500 MG tablet Take 500 mg by mouth 2 (two) times daily with a meal.   Yes [provider]  polyethylene glycol-electrolytes (TRILYTE) 420 g solution Take 4,000 mLs by mouth as directed. 12/08/18  Yes Ivan Lacher, Cristopher Estimable, MD  simvastatin (ZOCOR) 20 MG tablet Take 20 mg by mouth at bedtime.    Yes [provider]  verapamil (CALAN-SR) 240 MG CR tablet Take 240 mg by mouth at bedtime.     Yes [provider]  Misc. Devices MISC Please provide patient with mastectomy prothesis. 12/30/17   Higgs, Mathis Dad, MD  Misc. Devices MISC Please provide patient with mastectomy bras. 12/30/17   Higgs, Mathis Dad, MD    Allergies as of 12/08/2018 - Review Complete 12/08/2018  Allergen Reaction Noted  . Penicillins      Family History  Problem Relation Age of Onset  . Diabetes Mother   . Stroke Father   . Cancer Sister   . Colon cancer Neg Hx   . Colon polyps Neg Hx     Social History   Socioeconomic History  . Marital status: Married    Spouse name: Not on file  . Number of children: Not on file  . Years of education: Not on file  . Highest education level: Not on file  Occupational History  . Occupation: retired  Scientific laboratory technician  . Financial resource strain: Not on file  . Food insecurity    Worry: Not on file    Inability: Not on file  . Transportation needs    Medical: Not on file    Non-medical: Not on file  Tobacco Use  . Smoking status: Never Smoker  . Smokeless tobacco: Never Used  Substance and Sexual Activity  . Alcohol use: No  . Drug use: No  . Sexual activity: Not on file  Lifestyle  . Physical activity  Days per week: Not on file    Minutes per session: Not on file  . Stress: Not on file  Relationships  . Social Herbalist on phone: Not on file    Gets together: Not on file    Attends religious service: Not on file    Active member of club or organization: Not on file    Attends meetings of clubs or organizations: Not on file    Relationship status: Not on file  . Intimate partner violence    Fear of current or ex partner: Not on file    Emotionally abused: Not on file    Physically abused: Not on file    Forced sexual activity: Not on file  Other Topics Concern  . Not on file  Social History Narrative  . Not on file    Review of Systems: See HPI, otherwise negative ROS  Physical Exam: BP (!) 162/82   Pulse 70   Temp 98 F (36.7 C)  (Oral)   Resp (!) 21   Ht 5\' 7"  (1.702 m)   Wt 73 kg   SpO2 99%   BMI 25.22 kg/m  General:   Alert,  , pleasant and cooperative in NAD SNeck:  Supple; no masses or thyromegaly. No significant cervical adenopathy. Lungs:  Clear throughout to auscultation.   No wheezes, crackles, or rhonchi. No acute distress. Heart:  Regular rate and rhythm; no murmurs, clicks, rubs,  or gallops. Abdomen: Non-distended, normal bowel sounds.  Soft and nontender without appreciable mass or hepatosplenomegaly.  Pulses:  Normal pulses noted. Extremities:  Without clubbing or edema.  Impression/Plan: 73 year old lady here for screening colonoscopy.  The risks, benefits, limitations, alternatives and imponderables have been reviewed with the patient. Questions have been answered. All parties are agreeable.      Notice: This dictation was prepared with Dragon dictation along with smaller phrase technology. Any transcriptional errors that result from this process are unintentional and may not be corrected upon review.

## 2019-02-02 NOTE — Discharge Instructions (Signed)
Colonoscopy Discharge Instructions  Read the instructions outlined below and refer to this sheet in the next few weeks. These discharge instructions provide you with general information on caring for yourself after you leave the hospital. Your doctor may also give you specific instructions. While your treatment has been planned according to the most current medical practices available, unavoidable complications occasionally occur. If you have any problems or questions after discharge, call Dr. Gala Romney at [phone]. ACTIVITY  You may resume your regular activity, but move at a slower pace for the next 24 hours.   Take frequent rest periods for the next 24 hours.   Walking will help get rid of the air and reduce the bloated feeling in your belly (abdomen).   No driving for 24 hours (because of the medicine (anesthesia) used during the test).    Do not sign any important legal documents or operate any machinery for 24 hours (because of the anesthesia used during the test).  NUTRITION  Drink plenty of fluids.   You may resume your normal diet as instructed by your doctor.   Begin with a light meal and progress to your normal diet. Heavy or fried foods are harder to digest and may make you feel sick to your stomach (nauseated).   Avoid alcoholic beverages for 24 hours or as instructed.  MEDICATIONS  You may resume your normal medications unless your doctor tells you otherwise.  WHAT YOU CAN EXPECT TODAY  Some feelings of bloating in the abdomen.   Passage of more gas than usual.   Spotting of blood in your stool or on the toilet paper.  IF YOU HAD POLYPS REMOVED DURING THE COLONOSCOPY:  No aspirin products for 7 days or as instructed.   No alcohol for 7 days or as instructed.   Eat a soft diet for the next 24 hours.  FINDING OUT THE RESULTS OF YOUR TEST Not all test results are available during your visit. If your test results are not back during the visit, make an appointment  with your caregiver to find out the results. Do not assume everything is normal if you have not heard from your caregiver or the medical facility. It is important for you to follow up on all of your test results.  SEEK IMMEDIATE MEDICAL ATTENTION IF:  You have more than a spotting of blood in your stool.   Your belly is swollen (abdominal distention).   You are nauseated or vomiting.   You have a temperature over 101.   You have abdominal pain or discomfort that is severe or gets worse throughout the day.   Colon polyp and diverticulosis information provided  Continue Benefiber daily  Further recommendations to follow pending review of pathology report  I called Zebedee Iba at [phone].  I was unable to reach him   Colon Polyps  Polyps are tissue growths inside the body. Polyps can grow in many places, including the large intestine (colon). A polyp may be a round bump or a mushroom-shaped growth. You could have one polyp or several. Most colon polyps are noncancerous (benign). However, some colon polyps can become cancerous over time. Finding and removing the polyps early can help prevent this. What are the causes? The exact cause of colon polyps is not known. What increases the risk? You are more likely to develop this condition if you:  Have a family history of colon cancer or colon polyps.  Are older than 58 or older than 45 if you are African  American.  Have inflammatory bowel disease, such as ulcerative colitis or Crohn's disease.  Have certain hereditary conditions, such as: ? Familial adenomatous polyposis. ? Lynch syndrome. ? Turcot syndrome. ? Peutz-Jeghers syndrome.  Are overweight.  Smoke cigarettes.  Do not get enough exercise.  Drink too much alcohol.  Eat a diet that is high in fat and red meat and low in fiber.  Had childhood cancer that was treated with abdominal radiation. What are the signs or symptoms? Most polyps do not cause symptoms. If  you have symptoms, they may include:  Blood coming from your rectum when having a bowel movement.  Blood in your stool. The stool may look dark red or black.  Abdominal pain.  A change in bowel habits, such as constipation or diarrhea. How is this diagnosed? This condition is diagnosed with a colonoscopy. This is a procedure in which a lighted, flexible scope is inserted into the anus and then passed into the colon to examine the area. Polyps are sometimes found when a colonoscopy is done as part of routine cancer screening tests. How is this treated? Treatment for this condition involves removing any polyps that are found. Most polyps can be removed during a colonoscopy. Those polyps will then be tested for cancer. Additional treatment may be needed depending on the results of testing. Follow these instructions at home: Lifestyle  Maintain a healthy weight, or lose weight if recommended by your health care provider.  Exercise every day or as told by your health care provider.  Do not use any products that contain nicotine or tobacco, such as cigarettes and e-cigarettes. If you need help quitting, ask your health care provider.  If you drink alcohol, limit how much you have: ? 0-1 drink a day for women. ? 0-2 drinks a day for men.  Be aware of how much alcohol is in your drink. In the [place] one drink equals one 12 oz bottle of beer (355 mL), one 5 oz glass of wine (148 mL), or one 1 oz shot of hard liquor (44 mL). Eating and drinking   Eat foods that are high in fiber, such as fruits, vegetables, and whole grains.  Eat foods that are high in calcium and vitamin D, such as milk, cheese, yogurt, eggs, liver, fish, and broccoli.  Limit foods that are high in fat, such as fried foods and desserts.  Limit the amount of red meat and processed meat you eat, such as hot dogs, sausage, bacon, and lunch meats. General instructions  Keep all follow-up visits as told by your health care  provider. This is important. ? This includes having regularly scheduled colonoscopies. ? Talk to your health care provider about when you need a colonoscopy. Contact a health care provider if:  You have new or worsening bleeding during a bowel movement.  You have new or increased blood in your stool.  You have a change in bowel habits.  You lose weight for no known reason. Summary  Polyps are tissue growths inside the body. Polyps can grow in many places, including the colon.  Most colon polyps are noncancerous (benign), but some can become cancerous over time.  This condition is diagnosed with a colonoscopy.  Treatment for this condition involves removing any polyps that are found. Most polyps can be removed during a colonoscopy. This information is not intended to replace advice given to you by your health care provider. Make sure you discuss any questions you have with your health care provider. Document  Released: 02/27/2004 Document Revised: 09/17/2017 Document Reviewed: 09/17/2017 Elsevier Patient Education  [place].    Diverticulitis  Diverticulitis is when small pockets in your large intestine (colon) get infected or swollen. This causes stomach pain and watery poop (diarrhea). These pouches are called diverticula. They form in people who have a condition called diverticulosis. Follow these instructions at home: Medicines  Take over-the-counter and prescription medicines only as told by your doctor. These include: ? Antibiotics. ? Pain medicines. ? Fiber pills. ? Probiotics. ? Stool softeners.  Do not drive or use heavy machinery while taking prescription pain medicine.  If you were prescribed an antibiotic, take it as told. Do not stop taking it even if you feel better. General instructions   Follow a diet as told by your doctor.  When you feel better, your doctor may tell you to change your diet. You may need to eat a lot of fiber. Fiber makes it  easier to poop (have bowel movements). Healthy foods with fiber include: ? Berries. ? Beans. ? Lentils. ? Green vegetables.  Exercise 3 or more times a week. Aim for 30 minutes each time. Exercise enough to sweat and make your heart beat faster.  Keep all follow-up visits as told. This is important. You may need to have an exam of the large intestine. This is called a colonoscopy. Contact a doctor if:  Your pain does not get better.  You have a hard time eating or drinking.  You are not pooping like normal. Get help right away if:  Your pain gets worse.  Your problems do not get better.  Your problems get worse very fast.  You have a fever.  You throw up (vomit) more than one time.  You have poop that is: ? Bloody. ? Black. ? Tarry. Summary  Diverticulitis is when small pockets in your large intestine (colon) get infected or swollen.  Take medicines only as told by your doctor.  Follow a diet as told by your doctor. This information is not intended to replace advice given to you by your health care provider. Make sure you discuss any questions you have with your health care provider. Document Released: 11/19/2007 Document Revised: 05/15/2017 Document Reviewed: 06/19/2016 Elsevier Patient Education  2020 [employer].

## 2019-02-06 ENCOUNTER — Encounter: Payer: Self-pay | Admitting: Internal Medicine

## 2019-02-06 NOTE — Op Note (Signed)
[facility] Patient Name: Michaela Shaffer Procedure Date: 02/02/2019 11:12 AM MRN: [id number] Date of Birth: 07/03/1945 Attending MD: Norvel Richards , MD CSN: [id number] Age: 73 Admit Type: Outpatient Procedure:                Colonoscopy Indications:              Screening for colorectal malignant neoplasm Providers:                Norvel Richards, MD, Charlsie Quest. Theda Sers RN, RN,                            Nelma Rothman, Technician Referring MD:              Medicines:                Meperidine 25 mg IV, Midazolam 4 mg IV, Ondansetron                            4 mg IV Complications:            No immediate complications. Estimated Blood Loss:     Estimated blood loss was minimal. Procedure:                Pre-Anesthesia Assessment:                           - Prior to the procedure, a History and Physical                            was performed, and patient medications and                            allergies were reviewed. The patient's tolerance of                            previous anesthesia was also reviewed. The risks                            and benefits of the procedure and the sedation                            options and risks were discussed with the patient.                            All questions were answered, and informed consent                            was obtained. Prior Anticoagulants: The patient has                            taken no previous anticoagulant or antiplatelet                            agents. ASA Grade Assessment: II - A patient with  mild systemic disease. After reviewing the risks                            and benefits, the patient was deemed in                            satisfactory condition to undergo the procedure.                           After obtaining informed consent, the colonoscope                            was passed under direct vision. Throughout the                            procedure,  the patient's blood pressure, pulse, and                            oxygen saturations were monitored continuously. The                            [id number] scope was introduced through                            the anus and advanced to the the cecum, identified                            by appendiceal orifice and ileocecal valve. The                            colonoscopy was performed without difficulty. The                            patient tolerated the procedure well. The quality                            of the bowel preparation was adequate. Scope In: 12:08:50 PM Scope Out: 12:27:00 PM Scope Withdrawal Time: 0 hours 13 minutes 7 seconds  Total Procedure Duration: 0 hours 18 minutes 10 seconds  Findings:      The perianal and digital rectal examinations were normal.      A few medium-mouthed diverticula were found in the sigmoid colon and       descending colon.      A 4 mm polyp was found in the cecum. The polyp was sessile. The polyp       was removed with a cold snare. Resection and retrieval were complete.       Estimated blood loss was minimal.      The exam was otherwise without abnormality on direct and retroflexion       views. Impression:               - Diverticulosis in the sigmoid colon and in the                            descending colon.                           -  One 4 mm polyp in the cecum, removed with a cold                            snare. Resected and retrieved.                           - The examination was otherwise normal on direct                            and retroflexion views. Moderate Sedation:      Moderate (conscious) sedation was administered by the endoscopy nurse       and supervised by the endoscopist. The following parameters were       monitored: oxygen saturation, heart rate, blood pressure, respiratory       rate, EKG, adequacy of pulmonary ventilation, and response to care.       Total physician intraservice time was 23  minutes. Recommendation:           - Patient has a contact number available for                            emergencies. The signs and symptoms of potential                            delayed complications were discussed with the                            patient. Return to normal activities tomorrow.                            Written discharge instructions were provided to the                            patient.                           - Resume previous diet.                           - Continue present medications.                           - Repeat colonoscopy date to be determined after                            pending pathology results are reviewed for                            surveillance based on pathology results.                           - Return to GI office (date not yet determined). Procedure Code(s):        --- Professional ---                           [phone], Colonoscopy, flexible; with removal of  tumor(s), polyp(s), or other lesion(s) by snare                            technique                           99153, Moderate sedation; each additional 15                            minutes intraservice time                           G0500, Moderate sedation services provided by the                            same physician or other qualified health care                            professional performing a gastrointestinal                            endoscopic service that sedation supports,                            requiring the presence of an independent trained                            observer to assist in the monitoring of the                            patient's level of consciousness and physiological                            status; initial 15 minutes of intra-service time;                            patient age 4 years or older (additional time may                            be reported with [phone], as appropriate) Diagnosis  Code(s):        --- Professional ---                           Z12.11, Encounter for screening for malignant                            neoplasm of colon                           K63.5, Polyp of colon                           K57.30, Diverticulosis of large intestine without                            perforation or abscess without  bleeding CPT copyright 2019 American Medical Association. All rights reserved. The codes documented in this report are preliminary and upon coder review may  be revised to meet current compliance requirements. Cristopher Estimable. Christena Sunderlin, MD Norvel Richards, MD 02/06/2019 5:37:39 PM This report has been signed electronically. Number of Addenda: 0

## 2019-02-09 ENCOUNTER — Encounter (HOSPITAL_COMMUNITY): Payer: Self-pay | Admitting: Internal Medicine

## 2019-04-22 ENCOUNTER — Other Ambulatory Visit (HOSPITAL_COMMUNITY): Payer: Self-pay | Admitting: Nurse Practitioner

## 2019-04-22 DIAGNOSIS — C50911 Malignant neoplasm of unspecified site of right female breast: Secondary | ICD-10-CM

## 2019-04-25 ENCOUNTER — Other Ambulatory Visit: Payer: Self-pay

## 2019-04-25 ENCOUNTER — Inpatient Hospital Stay (HOSPITAL_COMMUNITY): Payer: Medicare Other | Attending: Hematology

## 2019-04-25 DIAGNOSIS — Z853 Personal history of malignant neoplasm of breast: Secondary | ICD-10-CM | POA: Diagnosis present

## 2019-04-25 DIAGNOSIS — E538 Deficiency of other specified B group vitamins: Secondary | ICD-10-CM | POA: Insufficient documentation

## 2019-04-25 DIAGNOSIS — C50911 Malignant neoplasm of unspecified site of right female breast: Secondary | ICD-10-CM

## 2019-04-25 LAB — CBC WITH DIFFERENTIAL/PLATELET
Abs Immature Granulocytes: 0.02 10*3/uL (ref 0.00–0.07)
Basophils Absolute: 0.1 10*3/uL (ref 0.0–0.1)
Basophils Relative: 1 %
Eosinophils Absolute: 0.1 10*3/uL (ref 0.0–0.5)
Eosinophils Relative: 3 %
HCT: 39.1 % (ref 36.0–46.0)
Hemoglobin: 12.5 g/dL (ref 12.0–15.0)
Immature Granulocytes: 1 %
Lymphocytes Relative: 48 %
Lymphs Abs: 2 10*3/uL (ref 0.7–4.0)
MCH: 26.4 pg (ref 26.0–34.0)
MCHC: 32 g/dL (ref 30.0–36.0)
MCV: 82.5 fL (ref 80.0–100.0)
Monocytes Absolute: 0.4 10*3/uL (ref 0.1–1.0)
Monocytes Relative: 9 %
Neutro Abs: 1.6 10*3/uL — ABNORMAL LOW (ref 1.7–7.7)
Neutrophils Relative %: 38 %
Platelets: 240 10*3/uL (ref 150–400)
RBC: 4.74 MIL/uL (ref 3.87–5.11)
RDW: 13.8 % (ref 11.5–15.5)
WBC: 4.1 10*3/uL (ref 4.0–10.5)
nRBC: 0 % (ref 0.0–0.2)

## 2019-04-25 LAB — COMPREHENSIVE METABOLIC PANEL
ALT: 23 U/L (ref 0–44)
AST: 19 U/L (ref 15–41)
Albumin: 4.3 g/dL (ref 3.5–5.0)
Alkaline Phosphatase: 64 U/L (ref 38–126)
Anion gap: 8 (ref 5–15)
BUN: 20 mg/dL (ref 8–23)
CO2: 23 mmol/L (ref 22–32)
Calcium: 9.3 mg/dL (ref 8.9–10.3)
Chloride: 106 mmol/L (ref 98–111)
Creatinine, Ser: 0.85 mg/dL (ref 0.44–1.00)
GFR calc Af Amer: >60 mL/min (ref >60)
GFR calc non Af Amer: >60 mL/min (ref >60)
Glucose, Bld: 94 mg/dL (ref 70–99)
Potassium: 4.4 mmol/L (ref 3.5–5.1)
Sodium: 137 mmol/L (ref 135–145)
Total Bilirubin: 0.8 mg/dL (ref 0.3–1.2)
Total Protein: 7.3 g/dL (ref 6.5–8.1)

## 2019-04-25 LAB — VITAMIN B12: Vitamin B-12: 142 pg/mL — ABNORMAL LOW (ref 180–914)

## 2019-04-25 LAB — LACTATE DEHYDROGENASE: LDH: 145 U/L (ref 98–192)

## 2019-04-25 LAB — VITAMIN D 25 HYDROXY (VIT D DEFICIENCY, FRACTURES): Vit D, 25-Hydroxy: 38.56 ng/mL (ref 30–100)

## 2019-04-28 ENCOUNTER — Encounter (HOSPITAL_COMMUNITY): Payer: Self-pay | Admitting: Hematology

## 2019-04-28 ENCOUNTER — Other Ambulatory Visit: Payer: Self-pay

## 2019-04-28 ENCOUNTER — Inpatient Hospital Stay (HOSPITAL_COMMUNITY): Payer: Medicare Other | Admitting: Hematology

## 2019-04-28 VITALS — BP 146/75 | HR 88 | Temp 97.1°F | Resp 16 | Wt 159.6 lb

## 2019-04-28 DIAGNOSIS — E538 Deficiency of other specified B group vitamins: Secondary | ICD-10-CM | POA: Diagnosis not present

## 2019-04-28 DIAGNOSIS — Z853 Personal history of malignant neoplasm of breast: Secondary | ICD-10-CM | POA: Diagnosis not present

## 2019-04-28 DIAGNOSIS — Z1231 Encounter for screening mammogram for malignant neoplasm of breast: Secondary | ICD-10-CM

## 2019-04-28 DIAGNOSIS — C50911 Malignant neoplasm of unspecified site of right female breast: Secondary | ICD-10-CM | POA: Diagnosis not present

## 2019-04-28 MED ORDER — CYANOCOBALAMIN 1000 MCG/ML IJ SOLN
1000.0000 ug | Freq: Once | INTRAMUSCULAR | Status: AC
  Administered 2019-04-28: 11:00:00 1000 ug via INTRAMUSCULAR
  Filled 2019-04-28: qty 1

## 2019-04-28 NOTE — Progress Notes (Signed)
Michaela Shaffer, Michaela Shaffer [id number]   CLINIC:  Medical Oncology/Hematology  PCP:  Michaela Shaffer, [place] [address] [phone]   REASON FOR VISIT:  Follow-up for right breast cancer and a B12 deficiency.  CURRENT THERAPY: Observation and B12 tablet.  BRIEF ONCOLOGIC HISTORY:  Oncology History  Infiltrating ductal carcinoma of breast ([facility])  02/13/2005 Initial Diagnosis   Infiltrating ductal carcinoma of breast   03/10/2005 Surgery   Right lumpectomy demonstrating a 1.6 cm invasive ductal carcinoma involving the deep margin with 0/1 lymph nodes for disease.  ER 0%, PR 0%, Her2 negative, Ki-67 38%   03/19/2005 Surgery   Re-excision of deep margin showing no residula cancer   04/15/2005 - 06/19/2005 Chemotherapy   FEC x 4 cycles   07/10/2005 - 08/21/2005 Chemotherapy   Navelbine/Taxotere x 4 cycles   09/22/2005 - 11/06/2005 Radiation Therapy   [name]   09/13/2015 Imaging   CT chest performed by PCP for cough- Negative CT scan of the chest.      CANCER STAGING: Cancer Staging Infiltrating ductal carcinoma of breast ([place]) Staging form: Breast, AJCC 7th Edition - Clinical: Stage IA (T1c, N0, cM0) - Signed by Baird Cancer, PA-C on 10/06/2013    INTERVAL HISTORY:  Michaela Shaffer 73 y.o. female seen for follow-up of stage I right breast cancer.  Denies any new onset pains.  No fevers, night sweats or weight loss.  Appetite is 100%.  Energy levels are 75%.  No recurrent infections or hospitalizations.  Denies any headaches or vision changes.    REVIEW OF SYSTEMS:  Review of Systems  All other systems reviewed and are negative.    PAST MEDICAL/SURGICAL HISTORY:  Past Medical History:  Diagnosis Date  . Breast CA ([facility]) 2006   s/p chemo,rad [name]  . Constipation   . Diabetes mellitus   . Diverticula of colon    L side  . Fecal incontinence   . Hyperlipidemia   . Hypertension   . Infiltrating  ductal carcinoma of breast ([facility]) 04/25/2009   Qualifier: History of  By: Zeb Comfort    . S/P colonoscopy Dec 2005, Nov 2010   2005: normal, 2010: lax sphincter tone, left-sided diverticula    Past Surgical History:  Procedure Laterality Date  . BREAST BIOPSY Right 2014  . COLONOSCOPY  05/16/2004   Dr.Rourk- normal rectum, normal colon.  . COLONOSCOPY  05/02/2009   Dr.Rourk- lax sphincter tone, o/w normal rectum, few scattered L sided diverticula  . COLONOSCOPY N/A 02/02/2019   Procedure: COLONOSCOPY;  Surgeon: Daneil Dolin, MD;  Location: [facility];  Service: Endoscopy;  Laterality: N/A;  10:45am  . PARTIAL HYSTERECTOMY    . POLYPECTOMY  02/02/2019   Procedure: POLYPECTOMY;  Surgeon: Daneil Dolin, MD;  Location: [facility];  Service: Endoscopy;;  . portacath placement and removal    . right lumpectomy       SOCIAL HISTORY:  Social History   Socioeconomic History  . Marital status: Married    Spouse name: Not on file  . Number of children: Not on file  . Years of education: Not on file  . Highest education level: Not on file  Occupational History  . Occupation: retired  Scientific laboratory technician  . Financial resource strain: Not on file  . Food insecurity    Worry: Not on file    Inability: Not on file  . Transportation needs    Medical: Not  on file    Non-medical: Not on file  Tobacco Use  . Smoking status: Never Smoker  . Smokeless tobacco: Never Used  Substance and Sexual Activity  . Alcohol use: No  . Drug use: No  . Sexual activity: Not on file  Lifestyle  . Physical activity    Days per week: Not on file    Minutes per session: Not on file  . Stress: Not on file  Relationships  . Social Herbalist on phone: Not on file    Gets together: Not on file    Attends religious service: Not on file    Active member of club or organization: Not on file    Attends meetings of clubs or organizations: Not on file    Relationship status: Not on file  .  Intimate partner violence    Fear of current or ex partner: Not on file    Emotionally abused: Not on file    Physically abused: Not on file    Forced sexual activity: Not on file  Other Topics Concern  . Not on file  Social History Narrative  . Not on file    FAMILY HISTORY:  Family History  Problem Relation Age of Onset  . Diabetes Mother   . Stroke Father   . Cancer Sister   . Colon cancer Neg Hx   . Colon polyps Neg Hx     CURRENT MEDICATIONS:  Outpatient Encounter Medications as of 04/28/2019  Medication Sig  . aspirin 81 MG tablet Take 81 mg by mouth daily.    . Calcium Carbonate-Vitamin D (CALCIUM + D) 600-200 MG-UNIT TABS Take 1 tablet by mouth daily.   Marland Kitchen losartan-hydrochlorothiazide (HYZAAR) 100-25 MG per tablet Take 1 tablet by mouth daily.  . metFORMIN (GLUCOPHAGE) 500 MG tablet Take 500 mg by mouth 2 (two) times daily with a meal.  . Misc. Devices MISC Please provide patient with mastectomy prothesis.  . Misc. Devices MISC Please provide patient with mastectomy bras.  . simvastatin (ZOCOR) 20 MG tablet Take 20 mg by mouth at bedtime.   . verapamil (CALAN-SR) 240 MG CR tablet Take 240 mg by mouth at bedtime.    . [DISCONTINUED] losartan (COZAAR) 50 MG tablet Take 50 mg by mouth daily.   . [DISCONTINUED] polyethylene glycol-electrolytes (TRILYTE) 420 g solution Take 4,000 mLs by mouth as directed.  . [EXPIRED] cyanocobalamin ((VITAMIN B-12)) injection 1,000 mcg    No facility-administered encounter medications on file as of 04/28/2019.     ALLERGIES:  Allergies  Allergen Reactions  . Penicillins Rash    Did it involve swelling of the face/tongue/throat, SOB, or low BP? No Did it involve sudden or severe rash/hives, skin peeling, or any reaction on the inside of your mouth or nose? No Did you need to seek medical attention at a hospital or doctor's office? No When did it last happen?40+ years ago If all above answers are "NO", may proceed with  cephalosporin use.      PHYSICAL EXAM:  ECOG Performance status: 1  Vitals:   04/28/19 1010  BP: (!) 146/75  Pulse: 88  Resp: 16  Temp: (!) 97.1 F (36.2 C)  SpO2: 97%   Filed Weights   04/28/19 1010  Weight: 159 lb 9.6 oz (72.4 kg)    Physical Exam Vitals signs reviewed.  Constitutional:      Appearance: Normal appearance.  Cardiovascular:     Rate and Rhythm: Normal rate and regular rhythm.  Heart sounds: Normal heart sounds.  Pulmonary:     Effort: Pulmonary effort is normal.     Breath sounds: Normal breath sounds.  Abdominal:     General: There is no distension.     Palpations: Abdomen is soft. There is no mass.  Musculoskeletal:        General: No swelling.  Lymphadenopathy:     Cervical: No cervical adenopathy.  Skin:    General: Skin is warm.  Neurological:     General: No focal deficit present.     Mental Status: She is alert and oriented to person, place, and time.  Psychiatric:        Mood and Affect: Mood normal.        Behavior: Behavior normal.    Breast exam did not reveal any palpable masses.  LABORATORY DATA:  I have reviewed the labs as listed.  CBC    Component Value Date/Time   WBC 4.1 04/25/2019 1006   RBC 4.74 04/25/2019 1006   HGB 12.5 04/25/2019 1006   HCT 39.1 04/25/2019 1006   PLT 240 04/25/2019 1006   MCV 82.5 04/25/2019 1006   MCH 26.4 04/25/2019 1006   MCHC 32.0 04/25/2019 1006   RDW 13.8 04/25/2019 1006   LYMPHSABS 2.0 04/25/2019 1006   MONOABS 0.4 04/25/2019 1006   EOSABS 0.1 04/25/2019 1006   BASOSABS 0.1 04/25/2019 1006   CMP Latest Ref Rng & Units 04/25/2019 09/13/2015 01/16/2015  Glucose 70 - 99 mg/dL 94 - 88  BUN 8 - 23 mg/dL 20 - 22(H)  Creatinine 0.44 - 1.00 mg/dL 0.85 0.90 0.87  Sodium 135 - 145 mmol/L 137 - 131(L)  Potassium 3.5 - 5.1 mmol/L 4.4 - 3.1(L)  Chloride 98 - 111 mmol/L 106 - 98(L)  CO2 22 - 32 mmol/L 23 - 19(L)  Calcium 8.9 - 10.3 mg/dL 9.3 - 9.6  Total Protein 6.5 - 8.1 g/dL 7.3 - -   Total Bilirubin 0.3 - 1.2 mg/dL 0.8 - -  Alkaline Phos 38 - 126 U/L 64 - -  AST 15 - 41 U/L 19 - -  ALT 0 - 44 U/L 23 - -       DIAGNOSTIC IMAGING:  I have independently reviewed the scans and discussed with the patient.    ASSESSMENT & PLAN:   Infiltrating ductal carcinoma of breast 1.  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 -Tumor was 1.6 cm, grade 3, 1 - sentinel lymph node.  No lymphovascular invasion. -FEC x4 cycles followed by dose dense Taxotere and Navelbine for 4 cycles completed on 08/21/2005. -XRT of the right breast completed. -Today she denies any new onset pains.  Breast exam did not show any palpable masses. -I have recommended doing mammogram.  I have reviewed her blood work. -We will see her back in 1 year.  We will also schedule another mammogram prior to next visit in 1 year.  2.  Vitamin B12 deficiency: -Her vitamin B12 level was low at 142. -We have given B12 injection today.  She was told to take B12 1 mg tablet daily.      Orders placed this encounter:  Orders Placed This Encounter  Procedures  . MM 3D SCREEN BREAST BILATERAL  . MM 3D SCREEN BREAST BILATERAL  . CBC with Differential/Platelet  . Comprehensive metabolic panel  . Vitamin B12      Derek Jack, [place] [phone]

## 2019-04-28 NOTE — Patient Instructions (Signed)
[place] at [facility] Discharge Instructions  You were seen today by Dr. Delton Coombes. He went over your recent lab results. He will schedule you for a mammogram soon. He will give you a B12 injection today. He would like you to start taking 1 mg B12 over the counter daily.  He will see you back in 1 year for labs, mammogram  and follow up.   Thank you for choosing [place] at [facility] to provide your oncology and hematology care.  To afford each patient quality time with our provider, please arrive at least 15 minutes before your scheduled appointment time.   If you have a lab appointment with the [facility] please come in thru the  Main Entrance and check in at the main information desk  You need to re-schedule your appointment should you arrive 10 or more minutes late.  We strive to give you quality time with our providers, and arriving late affects you and other patients whose appointments are after yours.  Also, if you no show three or more times for appointments you may be dismissed from the clinic at the providers discretion.     Again, thank you for choosing [facility].  Our hope is that these requests will decrease the amount of time that you wait before being seen by our physicians.       _____________________________________________________________  Should you have questions after your visit to [facility], please contact our office at [phone] between the hours of 8:00 a.m. and 4:30 p.m.  Voicemails left after 4:00 p.m. will not be returned until the following business day.  For prescription refill requests, have your pharmacy contact our office and allow 72 hours.    Cancer Center Support Programs:   > Cancer Support Group  2nd Tuesday of the month 1pm-2pm, Journey Room

## 2019-04-28 NOTE — Assessment & Plan Note (Signed)
1.  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 -Tumor was 1.6 cm, grade 3, 1 - sentinel lymph node.  No lymphovascular invasion. -FEC x4 cycles followed by dose dense Taxotere and Navelbine for 4 cycles completed on 08/21/2005. -XRT of the right breast completed. -Today she denies any new onset pains.  Breast exam did not show any palpable masses. -I have recommended doing mammogram.  I have reviewed her blood work. -We will see her back in 1 year.  We will also schedule another mammogram prior to next visit in 1 year.  2.  Vitamin B12 deficiency: -Her vitamin B12 level was low at 142. -We have given B12 injection today.  She was told to take B12 1 mg tablet daily.

## 2019-05-02 ENCOUNTER — Telehealth (HOSPITAL_COMMUNITY): Payer: Self-pay | Admitting: *Deleted

## 2019-05-03 ENCOUNTER — Other Ambulatory Visit (HOSPITAL_COMMUNITY): Payer: Self-pay | Admitting: *Deleted

## 2019-05-03 DIAGNOSIS — C50911 Malignant neoplasm of unspecified site of right female breast: Secondary | ICD-10-CM

## 2019-05-16 ENCOUNTER — Ambulatory Visit (HOSPITAL_COMMUNITY)
Admission: RE | Admit: 2019-05-16 | Discharge: 2019-05-16 | Disposition: A | Discharge status: Home / Self Care | Payer: Medicare Other | Source: Ambulatory Visit | Attending: Hematology | Admitting: Hematology

## 2019-05-16 ENCOUNTER — Other Ambulatory Visit: Payer: Self-pay

## 2019-05-16 DIAGNOSIS — Z1231 Encounter for screening mammogram for malignant neoplasm of breast: Secondary | ICD-10-CM | POA: Insufficient documentation

## 2019-12-10 ENCOUNTER — Other Ambulatory Visit: Payer: Self-pay

## 2019-12-10 ENCOUNTER — Emergency Department (HOSPITAL_COMMUNITY)
Admission: EM | Admit: 2019-12-10 | Discharge: 2019-12-10 | Disposition: A | Discharge status: Home / Self Care | Payer: Medicare Other | Attending: Emergency Medicine | Admitting: Emergency Medicine

## 2019-12-10 ENCOUNTER — Encounter (HOSPITAL_COMMUNITY): Payer: Self-pay | Admitting: Emergency Medicine

## 2019-12-10 DIAGNOSIS — E785 Hyperlipidemia, unspecified: Secondary | ICD-10-CM | POA: Diagnosis not present

## 2019-12-10 DIAGNOSIS — Z7982 Long term (current) use of aspirin: Secondary | ICD-10-CM | POA: Diagnosis not present

## 2019-12-10 DIAGNOSIS — Z7984 Long term (current) use of oral hypoglycemic drugs: Secondary | ICD-10-CM | POA: Diagnosis not present

## 2019-12-10 DIAGNOSIS — E119 Type 2 diabetes mellitus without complications: Secondary | ICD-10-CM | POA: Diagnosis not present

## 2019-12-10 DIAGNOSIS — I10 Essential (primary) hypertension: Secondary | ICD-10-CM | POA: Diagnosis present

## 2019-12-10 DIAGNOSIS — Z79899 Other long term (current) drug therapy: Secondary | ICD-10-CM | POA: Insufficient documentation

## 2019-12-10 NOTE — Discharge Instructions (Signed)
Make an appointment to follow-up with your primary care doctor.  Keep a daily blood pressure log.  Continue take your medications.  Return for chest pain severe headache shortness of breath or any strokelike symptoms.  Otherwise I think your regular doctor will take a look at your blood pressure trend and then gently adjust your medication.  Possible that the stress with your home situation could be playing a role in the elevated blood pressure.

## 2019-12-10 NOTE — ED Triage Notes (Signed)
Patient c/o hypertension. Per patient seen at PCP last week and blood pressure 120/80. Doctor rechecked 2 days ago 160/73. Patient checked blood pressure today and it was 180/84. Denies any headaches or dizziness. Patient does take blood pressure medication Losartan and verapamil. Patient did report some left shoulder pain yesterday after lifting heavy load of clothes. Denies any pain now.

## 2019-12-10 NOTE — ED Provider Notes (Signed)
[facility] EMERGENCY DEPARTMENT Provider Note   CSN: [id number] Arrival date & time: 12/10/19  1830     History Chief Complaint  Patient presents with   Hypertension    Michaela Shaffer is a 74 y.o. female.  Patient presenting with concerns for elevated blood pressure.  Sugars been elevated since Thursday.  Patient was seen on Wednesday and blood pressure was like 631 systolic.  Here tonight is been averaging more like and 160s over 70s.  Patient denies any headache any chest pain any shortness of breath any strokelike symptoms.  There was a little bit of some left shoulder pain yesterday after lifting a heavy load to close.  But that has not persisted.  Patient's primary care doctor is Dr. Hilma Favors.  She does have a known history of hypertension she is on Cozaar and verapamil.  States that her blood pressures have been well controlled until just recently.  But she is under some stress due to her husband undergoing chemotherapy.        Past Medical History:  Diagnosis Date   Breast CA ([place]) 2006   s/p chemo,rad [name]   Constipation    Diabetes mellitus    Diverticula of colon    L side   Fecal incontinence    Hyperlipidemia    Hypertension    Infiltrating ductal carcinoma of breast ([place]) 04/25/2009   Qualifier: History of  By: Zeb Comfort     S/P colonoscopy Dec 2005, Nov 2010   2005: normal, 2010: lax sphincter tone, left-sided diverticula     Patient Active Problem List   Diagnosis Date Noted   Constipation 12/08/2018   Encounter for screening colonoscopy 12/08/2018   Disorder of thyroid gland 04/27/2015   Diarrhea 01/22/2011   HEMATOCHEZIA 04/26/2009   Infiltrating ductal carcinoma of breast ([place]) 04/25/2009   DM 04/25/2009   HYPERLIPIDEMIA 04/25/2009   Essential hypertension 04/25/2009   FECAL INCONTINENCE 04/25/2009   CONSTIPATION, HX OF 04/25/2009    Past Surgical History:  Procedure Laterality Date   BREAST BIOPSY Right 2014    COLONOSCOPY  05/16/2004   Dr.Rourk- normal rectum, normal colon.   COLONOSCOPY  05/02/2009   Dr.Rourk- lax sphincter tone, o/w normal rectum, few scattered L sided diverticula   COLONOSCOPY N/A 02/02/2019   Procedure: COLONOSCOPY;  Surgeon: Daneil Dolin, MD;  Location: [facility] ENDO SUITE;  Service: Endoscopy;  Laterality: N/A;  10:45am   PARTIAL HYSTERECTOMY     POLYPECTOMY  02/02/2019   Procedure: POLYPECTOMY;  Surgeon: Daneil Dolin, MD;  Location: [facility] ENDO SUITE;  Service: Endoscopy;;   portacath placement and removal     right lumpectomy       OB History   No obstetric history on file.     Family History  Problem Relation Age of Onset   Diabetes Mother    Stroke Father    Cancer Sister    Colon cancer Neg Hx    Colon polyps Neg Hx     Social History   Tobacco Use   Smoking status: Never Smoker   Smokeless tobacco: Never Used  Vaping Use   Vaping Use: Never used  Substance Use Topics   Alcohol use: No   Drug use: No    Home Medications Prior to Admission medications   Medication Sig Start Date End Date Taking? Authorizing Provider  aspirin 81 MG tablet Take 81 mg by mouth daily.     Yes [provider]  losartan (COZAAR) 50 MG tablet  Take 2 tablets by mouth daily. 10/21/19  Yes [provider]  metFORMIN (GLUCOPHAGE) 500 MG tablet Take 500 mg by mouth 2 (two) times daily with a meal.   Yes [provider]  Misc. Devices MISC Please provide patient with mastectomy prothesis. 12/30/17  Yes Higgs, Mathis Dad, MD  Misc. Devices MISC Please provide patient with mastectomy bras. 12/30/17  Yes Higgs, Mathis Dad, MD  simvastatin (ZOCOR) 20 MG tablet Take 20 mg by mouth at bedtime.    Yes [provider]  verapamil (CALAN-SR) 240 MG CR tablet Take 240 mg by mouth at bedtime.     Yes [provider]  Calcium Carbonate-Vitamin D (CALCIUM + D) 600-200 MG-UNIT TABS Take 1 tablet by mouth daily.     [provider]     Allergies    Penicillins  Review of Systems   Review of Systems  Constitutional: Negative for chills and fever.  HENT: Negative for congestion, rhinorrhea and sore throat.   Eyes: Negative for visual disturbance.  Respiratory: Negative for cough and shortness of breath.   Cardiovascular: Negative for chest pain and leg swelling.  Gastrointestinal: Negative for abdominal pain, diarrhea, nausea and vomiting.  Genitourinary: Negative for dysuria.  Musculoskeletal: Negative for back pain and neck pain.  Skin: Negative for rash.  Neurological: Negative for dizziness, facial asymmetry, speech difficulty, weakness, light-headedness, numbness and headaches.  Hematological: Does not bruise/bleed easily.  Psychiatric/Behavioral: Negative for confusion.    Physical Exam Updated Vital Signs BP (!) 155/82 (BP Location: Left Arm)    Pulse 69    Temp 98 F (36.7 C) (Oral)    Resp 17    Ht 1.702 m (5\' 7" )    Wt 71.2 kg    SpO2 97%    BMI 24.59 kg/m   Physical Exam Vitals and nursing note reviewed.  Constitutional:      General: She is not in acute distress.    Appearance: Normal appearance. She is well-developed.  HENT:     Head: Normocephalic and atraumatic.  Eyes:     Extraocular Movements: Extraocular movements intact.     Conjunctiva/sclera: Conjunctivae normal.     Pupils: Pupils are equal, round, and reactive to light.  Cardiovascular:     Rate and Rhythm: Normal rate and regular rhythm.     Heart sounds: No murmur heard.   Pulmonary:     Effort: Pulmonary effort is normal. No respiratory distress.     Breath sounds: Normal breath sounds.  Abdominal:     Palpations: Abdomen is soft.     Tenderness: There is no abdominal tenderness.  Musculoskeletal:        General: No swelling. Normal range of motion.     Cervical back: Normal range of motion and neck supple.  Skin:    General: Skin is warm and dry.     Capillary Refill: Capillary refill takes less than 2 seconds.   Neurological:     General: No focal deficit present.     Mental Status: She is alert and oriented to person, place, and time.     Cranial Nerves: No cranial nerve deficit.     Sensory: No sensory deficit.     Motor: No weakness.     ED Results / Procedures / Treatments   Labs (all labs ordered are listed, but only abnormal results are displayed) Labs Reviewed - No data to display  EKG None  Radiology No results found.  Procedures Procedures (including critical care time)  Medications  Ordered in ED Medications - No data to display  ED Course  I have reviewed the triage vital signs and the nursing notes.  Pertinent labs & imaging results that were available during my care of the patient were reviewed by me and considered in my medical decision making (see chart for details).    MDM Rules/Calculators/A&P                         Patient's blood pressures here without any intervention improved to the like of 160s over 70s.  Patient without any endorgan symptoms at all.  Patient's blood pressure had been well controlled until just this week.  Recommend she keep a daily log and give her primary care doctor a call.  Patient will return for any systemic symptoms.  Any strokelike symptoms chest pain shortness of breath or headache.  Patient clinically very stable here nontoxic no acute distress. Final Clinical Impression(s) / ED Diagnoses Final diagnoses:  Essential hypertension    Rx / DC Orders ED Discharge Orders    None       Fredia Sorrow, MD 12/10/19 [phone]

## 2020-02-10 ENCOUNTER — Ambulatory Visit: Payer: Medicare Other | Admitting: Internal Medicine

## 2020-02-10 ENCOUNTER — Encounter: Payer: Self-pay | Admitting: Internal Medicine

## 2020-02-10 ENCOUNTER — Other Ambulatory Visit: Payer: Self-pay

## 2020-02-10 VITALS — BP 125/69 | HR 79 | Temp 97.0°F | Ht 67.0 in | Wt 158.6 lb

## 2020-02-10 DIAGNOSIS — R159 Full incontinence of feces: Secondary | ICD-10-CM

## 2020-02-10 NOTE — Patient Instructions (Addendum)
Please take fiber supplement everyday without fail  Keep a "diary" on a calender as discussed  Heart Healthy - low fat/high fiber diet  Office visit here in 3 months

## 2020-02-10 NOTE — Progress Notes (Signed)
Primary Care Physician:  Sharilyn Sites, MD Primary Gastroenterologist:  Dr. Gala Romney  Pre-Procedure History & Physical: HPI:  Michaela Shaffer is a 74 y.o. female here for follow-up intermittent rectal seepage/leakage.  Happens a couple times weekly and when it does happen it occurs 2-3 times in a given day.  No bleeding.  No diarrhea.  When she takes generic methylcellulose product (equate),  It seems to get better.  She only takes this product sporadically.  He has not had any rectal bleeding.  Uses feminine napkins on occasion when she goes out for hygiene just in case. History of known lax anal sphincter tone. Colonoscopy last year demonstrate diverticulosis small adenoma-no subsequent colonoscopy recommended.  She takes Metformin.   Past Medical History:  Diagnosis Date  . Breast CA ([place]) 2006   s/p chemo,rad [name]  . Constipation   . Diabetes mellitus   . Diverticula of colon    L side  . Fecal incontinence   . Hyperlipidemia   . Hypertension   . 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 S/P colonoscopy Dec 2005, Nov 2010   2005: normal, 2010: lax sphincter tone, left-sided diverticula     Past Surgical History:  Procedure Laterality Date  . BREAST BIOPSY Right 2014  . COLONOSCOPY  05/16/2004   Dr.Christyl Osentoski- normal rectum, normal colon.  . COLONOSCOPY  05/02/2009   Dr.Ahmeer Tuman- lax sphincter tone, o/w normal rectum, few scattered L sided diverticula  . COLONOSCOPY N/A 02/02/2019   Procedure: COLONOSCOPY;  Surgeon: Daneil Dolin, MD;  Location: [facility] END[facility];  Service: Endoscopy;  Laterality: N/A;  10:45am  . PARTIAL HYSTERECTOMY    . POLYPECTOMY  02/02/2019   Procedure: POLYPECTOMY;  Surgeon: Daneil Dolin, MD;  Location: [facility] ENDO SU[facility];  Service: Endoscopy;;  . portacath placement and removal    . right lumpectomy      Prior to Admission medications   Medication Sig Start Date End Date Taking? Authorizing  Provider  aspirin 81 MG tablet Take 81 mg by mouth daily.     Yes [provider]  Calcium Carbonate-Vitamin D (CALCIUM + D) 600-200 MG-UNIT TABS Take 1 tablet by mouth daily.    Yes [provider]  metFORMIN (GLUCOPHAGE) 500 MG tablet Take 500 mg by mouth 2 (two) times daily with a meal.   Yes [provider]  methylcellulose oral powder Take by mouth as needed.   Yes [provider]  Misc. Devices MISC Please provide patient with mastectomy prothesis. 12/30/17  Yes Higgs, Mathis Dad, MD  Misc. Devices MISC Please provide patient with mastectomy bras. 12/30/17  Yes Higgs, Mathis Dad, MD  simvastatin (ZOCOR) 20 MG tablet Take 20 mg by mouth at bedtime.    Yes [provider]  valsartan (DIOVAN) 320 MG tablet Take 320 mg by mouth daily.   Yes [provider]  verapamil (CALAN-SR) 240 MG CR tablet Take 240 mg by mouth at bedtime.     Yes [provider]  vitamin B-12 (CYANOCOBALAMIN) 1000 MCG tablet Take 1,000 mcg by mouth daily.   Yes [provider]  losartan (COZAAR) 50 MG tablet Take 2 tablets by mouth daily. Patient not taking: Reported on 02/10/2020 10/21/19   [provider]    Allergies as of 02/10/2020 - Review Complete 02/10/2020  Allergen Reaction Noted  . Penicillins Rash     Family History  Problem Relation Age of Onset  .  Diabetes Mother   . Stroke Father   . Cancer Sister   . Colon cancer Neg Hx   . Colon polyps Neg Hx     Social History   Socioeconomic History  . Marital status: Married    Spouse name: Not on file  . Number of children: Not on file  . Years of education: Not on file  . Highest education level: Not on file  Occupational History  . Occupation: retired  Tobacco Use  . Smoking status: Never Smoker  . Smokeless tobacco: Never Used  Vaping Use  . Vaping Use: Never used  Substance and Sexual Activity  . Alcohol use: No  . Drug use: No  . Sexual activity: Not on file  Other Topics  Concern  . Not on file  Social History Narrative  . Not on file   Social Determinants of Health   Financial Resource Strain:   . Difficulty of Paying Living Expenses: Not on file  Food Insecurity:   . Worried About Charity fundraiser in the Last Year: Not on file  . Ran Out of Food in the Last Year: Not on file  Transportation Needs:   . Lack of Transportation (Medical): Not on file  . Lack of Transportation (Non-Medical): Not on file  Physical Activity:   . Days of Exercise per Week: Not on file  . Minutes of Exercise per Session: Not on file  Stress:   . Feeling of Stress : Not on file  Social Connections:   . Frequency of Communication with Friends and Family: Not on file  . Frequency of Social Gatherings with Friends and Family: Not on file  . Attends Religious Services: Not on file  . Active Member of Clubs or Organizations: Not on file  . Attends Archivist Meetings: Not on file  . Marital Status: Not on file  Intimate Partner Violence:   . Fear of Current or Ex-Partner: Not on file  . Emotionally Abused: Not on file  . Physically Abused: Not on file  . Sexually Abused: Not on file    Review of Systems: See HPI, otherwise negative ROS  Physical Exam: BP 125/69   Pulse 79   Temp (!) 97 F (36.1 C) (Oral)   Ht 5\' 7"  (1.702 m)   Wt 158 lb 9.6 oz (71.9 kg)   BMI 24.84 kg/m  General:   Alert,  Well-developed, well-nourished, pleasant and cooperative in NAD Abdomen: Non-distended, normal bowel sounds.  Soft and nontender without appreciable mass or hepatosplenomegaly.  Pulses:  Normal pulses noted. Extremities:  Without clubbing or edema.  Impression/Plan: 74 year-old lady with intermittent episodes of rectal seepage/leakage.  No diarrhea.  Symptoms likely multifactorial in etiology.  She does get a response from fiber intake but her sporadic use of fiber is not ideal.  No alarm symptoms.  Recent colonoscopy reassuring.  Recommendations:   Please take  fiber supplement everyday without fail  Keep a "diary" on a calender as discussed  Heart Healthy - low fat/high fiber diet  Office visit here in 3 months   Notice: This dictation was prepared with Dragon dictation along with smaller phrase technology. Any transcriptional errors that result from this process are unintentional and may not be corrected upon review.

## 2020-04-30 ENCOUNTER — Encounter: Payer: Self-pay | Admitting: Internal Medicine

## 2020-05-16 ENCOUNTER — Other Ambulatory Visit (HOSPITAL_COMMUNITY): Payer: Self-pay

## 2020-05-16 DIAGNOSIS — E538 Deficiency of other specified B group vitamins: Secondary | ICD-10-CM

## 2020-05-16 DIAGNOSIS — C50911 Malignant neoplasm of unspecified site of right female breast: Secondary | ICD-10-CM

## 2020-05-17 ENCOUNTER — Ambulatory Visit (HOSPITAL_COMMUNITY): Payer: Medicare Other

## 2020-05-17 ENCOUNTER — Inpatient Hospital Stay (HOSPITAL_COMMUNITY): Payer: Medicare Other | Attending: Hematology

## 2020-05-17 ENCOUNTER — Other Ambulatory Visit: Payer: Self-pay

## 2020-05-17 DIAGNOSIS — Z9221 Personal history of antineoplastic chemotherapy: Secondary | ICD-10-CM | POA: Insufficient documentation

## 2020-05-17 DIAGNOSIS — Z7984 Long term (current) use of oral hypoglycemic drugs: Secondary | ICD-10-CM | POA: Diagnosis not present

## 2020-05-17 DIAGNOSIS — Z79899 Other long term (current) drug therapy: Secondary | ICD-10-CM | POA: Insufficient documentation

## 2020-05-17 DIAGNOSIS — I10 Essential (primary) hypertension: Secondary | ICD-10-CM | POA: Insufficient documentation

## 2020-05-17 DIAGNOSIS — E538 Deficiency of other specified B group vitamins: Secondary | ICD-10-CM | POA: Diagnosis not present

## 2020-05-17 DIAGNOSIS — C50911 Malignant neoplasm of unspecified site of right female breast: Secondary | ICD-10-CM | POA: Insufficient documentation

## 2020-05-17 DIAGNOSIS — Z171 Estrogen receptor negative status [ER-]: Secondary | ICD-10-CM | POA: Insufficient documentation

## 2020-05-17 DIAGNOSIS — E119 Type 2 diabetes mellitus without complications: Secondary | ICD-10-CM | POA: Insufficient documentation

## 2020-05-17 LAB — CBC WITH DIFFERENTIAL/PLATELET
Abs Immature Granulocytes: 0.01 10*3/uL (ref 0.00–0.07)
Basophils Absolute: 0 10*3/uL (ref 0.0–0.1)
Basophils Relative: 1 %
Eosinophils Absolute: 0.1 10*3/uL (ref 0.0–0.5)
Eosinophils Relative: 2 %
HCT: 40.6 % (ref 36.0–46.0)
Hemoglobin: 12.7 g/dL (ref 12.0–15.0)
Immature Granulocytes: 0 %
Lymphocytes Relative: 48 %
Lymphs Abs: 1.7 10*3/uL (ref 0.7–4.0)
MCH: 26.3 pg (ref 26.0–34.0)
MCHC: 31.3 g/dL (ref 30.0–36.0)
MCV: 84.1 fL (ref 80.0–100.0)
Monocytes Absolute: 0.3 10*3/uL (ref 0.1–1.0)
Monocytes Relative: 7 %
Neutro Abs: 1.5 10*3/uL — ABNORMAL LOW (ref 1.7–7.7)
Neutrophils Relative %: 42 %
Platelets: 225 10*3/uL (ref 150–400)
RBC: 4.83 MIL/uL (ref 3.87–5.11)
RDW: 13.8 % (ref 11.5–15.5)
WBC: 3.6 10*3/uL — ABNORMAL LOW (ref 4.0–10.5)
nRBC: 0 % (ref 0.0–0.2)

## 2020-05-17 LAB — COMPREHENSIVE METABOLIC PANEL
ALT: 21 U/L (ref 0–44)
AST: 18 U/L (ref 15–41)
Albumin: 4.2 g/dL (ref 3.5–5.0)
Alkaline Phosphatase: 47 U/L (ref 38–126)
Anion gap: 7 (ref 5–15)
BUN: 17 mg/dL (ref 8–23)
CO2: 23 mmol/L (ref 22–32)
Calcium: 9.4 mg/dL (ref 8.9–10.3)
Chloride: 107 mmol/L (ref 98–111)
Creatinine, Ser: 0.89 mg/dL (ref 0.44–1.00)
GFR, Estimated: >60 mL/min (ref >60)
Glucose, Bld: 113 mg/dL — ABNORMAL HIGH (ref 70–99)
Potassium: 4.2 mmol/L (ref 3.5–5.1)
Sodium: 137 mmol/L (ref 135–145)
Total Bilirubin: 0.7 mg/dL (ref 0.3–1.2)
Total Protein: 7.3 g/dL (ref 6.5–8.1)

## 2020-05-17 LAB — VITAMIN B12: Vitamin B-12: 984 pg/mL — ABNORMAL HIGH (ref 180–914)

## 2020-05-21 ENCOUNTER — Ambulatory Visit (HOSPITAL_COMMUNITY): Payer: Medicare Other | Admitting: Hematology

## 2020-05-25 ENCOUNTER — Ambulatory Visit (HOSPITAL_COMMUNITY): Payer: Medicare Other | Admitting: Hematology and Oncology

## 2020-05-28 ENCOUNTER — Other Ambulatory Visit: Payer: Self-pay

## 2020-05-28 ENCOUNTER — Ambulatory Visit (HOSPITAL_COMMUNITY)
Admission: RE | Admit: 2020-05-28 | Discharge: 2020-05-28 | Disposition: A | Discharge status: Home / Self Care | Payer: Medicare Other | Source: Ambulatory Visit | Attending: Hematology | Admitting: Hematology

## 2020-05-28 DIAGNOSIS — Z1231 Encounter for screening mammogram for malignant neoplasm of breast: Secondary | ICD-10-CM | POA: Insufficient documentation

## 2020-05-30 ENCOUNTER — Other Ambulatory Visit: Payer: Self-pay

## 2020-05-30 ENCOUNTER — Other Ambulatory Visit (HOSPITAL_COMMUNITY): Payer: Self-pay | Admitting: Physician Assistant

## 2020-05-30 ENCOUNTER — Ambulatory Visit (HOSPITAL_COMMUNITY)
Admission: RE | Admit: 2020-05-30 | Discharge: 2020-05-30 | Disposition: A | Discharge status: Home / Self Care | Payer: Medicare Other | Source: Ambulatory Visit | Attending: Physician Assistant | Admitting: Physician Assistant

## 2020-05-30 DIAGNOSIS — M25512 Pain in left shoulder: Secondary | ICD-10-CM

## 2020-06-07 ENCOUNTER — Other Ambulatory Visit (HOSPITAL_COMMUNITY): Payer: Self-pay | Admitting: Hematology

## 2020-06-07 ENCOUNTER — Encounter (HOSPITAL_COMMUNITY): Payer: Self-pay | Admitting: Hematology and Oncology

## 2020-06-07 ENCOUNTER — Other Ambulatory Visit: Payer: Self-pay

## 2020-06-07 ENCOUNTER — Inpatient Hospital Stay (HOSPITAL_COMMUNITY): Payer: Medicare Other | Admitting: Hematology and Oncology

## 2020-06-07 VITALS — BP 169/79 | HR 78 | Temp 97.7°F | Resp 18 | Wt 158.9 lb

## 2020-06-07 DIAGNOSIS — C50911 Malignant neoplasm of unspecified site of right female breast: Secondary | ICD-10-CM

## 2020-06-07 DIAGNOSIS — Z1231 Encounter for screening mammogram for malignant neoplasm of breast: Secondary | ICD-10-CM

## 2020-06-07 NOTE — Progress Notes (Signed)
[address] [place], [place] [address]   CLINIC:  Medical Oncology/Hematology  PCP:  Sharilyn Sites, [address] [place] [address] [phone]   REASON FOR VISIT:   Follow-up for right breast cancer and B12 deficiency.  CURRENT THERAPY: Observation   BRIEF ONCOLOGIC HISTORY:  Oncology History  Infiltrating ductal carcinoma of breast ([place])  02/13/2005 Initial Diagnosis   Infiltrating ductal carcinoma of breast   03/10/2005 Surgery   Right lumpectomy demonstrating a 1.6 cm invasive ductal carcinoma involving the deep margin with 0/1 lymph nodes for disease.  ER 0%, PR 0%, Her2 negative, Ki-67 38%   03/19/2005 Surgery   Re-excision of deep margin showing no residula cancer   04/15/2005 - 06/19/2005 Chemotherapy   FEC x 4 cycles   07/10/2005 - 08/21/2005 Chemotherapy   Navelbine/Taxotere x 4 cycles   09/22/2005 - 11/06/2005 Radiation Therapy   [name]   09/13/2015 Imaging   CT chest performed by PCP for cough- Negative CT scan of the chest.      CANCER STAGING: Cancer Staging Infiltrating ductal carcinoma of breast ([place]) Staging form: Breast, AJCC 7th Edition - Clinical: Stage IA (T1c, N0, cM0) - Signed by Baird Cancer, PA-C on 10/06/2013    INTERVAL HISTORY:   Michaela Shaffer 74 y.o. female seen for follow-up of stage I right breast cancer.  Denies any new onset pains.     REVIEW OF SYSTEMS:  Review of Systems  All other systems reviewed and are negative.    PAST MEDICAL/SURGICAL HISTORY:  Past Medical History:  Diagnosis Date  . Breast CA ([place]) 2006   s/p chemo,rad [name]  . Constipation   . Diabetes mellitus   . Diverticula of colon    L side  . Fecal incontinence   . Hyperlipidemia   . Hypertension   . 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 S/P colonoscopy Dec 2005, Nov 2010   2005: normal, 2010: lax sphincter tone, left-sided  diverticula    Past Surgical History:  Procedure Laterality Date  . BREAST BIOPSY Right 2014  . COLONOSCOPY  05/16/2004   Dr.Rourk- normal rectum, normal colon.  . COLONOSCOPY  05/02/2009   Dr.Rourk- lax sphincter tone, o/w normal rectum, few scattered L sided diverticula  . COLONOSCOPY N/A 02/02/2019   Procedure: COLONOSCOPY;  Surgeon: Daneil Dolin, MD;  Location: [facility];  Service: Endoscopy;  Laterality: N/A;  10:45am  . PARTIAL HYSTERECTOMY    . POLYPECTOMY  02/02/2019   Procedure: POLYPECTOMY;  Surgeon: Daneil Dolin, MD;  Location: [facility] ENDO [facility];  Service: Endoscopy;;  . portacath placement and removal    . right lumpectomy       SOCIAL HISTORY:  Social History   Socioeconomic History  . Marital status: Married    Spouse name: Not on file  . Number of children: Not on file  . Years of education: Not on file  . Highest education level: Not on file  Occupational History  . Occupation: retired  Tobacco Use  . Smoking status: Never Smoker  . Smokeless tobacco: Never Used  Vaping Use  . Vaping Use: Never used  Substance and Sexual Activity  . Alcohol use: No  . Drug use: No  . Sexual activity: Not on file  Other Topics Concern  . Not on file  Social History Narrative  . Not on file   Social Determinants  of Health   Financial Resource Strain: Not on file  Food Insecurity: Not on file  Transportation Needs: Not on file  Physical Activity: Not on file  Stress: Not on file  Social Connections: Not on file  Intimate Partner Violence: Not on file    FAMILY HISTORY:  Family History  Problem Relation Age of Onset  . Diabetes Mother   . Stroke Father   . Cancer Sister   . Colon cancer Neg Hx   . Colon polyps Neg Hx     CURRENT MEDICATIONS:  Outpatient Encounter Medications as of 06/07/2020  Medication Sig  . aspirin 81 MG tablet Take 81 mg by mouth daily.  . Calcium Carbonate-Vitamin D 600-200 MG-UNIT TABS Take 1 tablet by mouth daily.  .  metFORMIN (GLUCOPHAGE) 500 MG tablet Take 500 mg by mouth 2 (two) times daily with a meal.  . methylcellulose oral powder Take by mouth as needed.  . Misc. Devices MISC Please provide patient with mastectomy prothesis.  . Misc. Devices MISC Please provide patient with mastectomy bras.  . naproxen (NAPROSYN) 375 MG tablet Take 375 mg by mouth 2 (two) times daily.  . simvastatin (ZOCOR) 20 MG tablet Take 20 mg by mouth at bedtime.   . valsartan (DIOVAN) 320 MG tablet Take 320 mg by mouth daily.  . verapamil (CALAN-SR) 240 MG CR tablet Take 240 mg by mouth at bedtime.  . vitamin B-12 (CYANOCOBALAMIN) 1000 MCG tablet Take 1,000 mcg by mouth daily.  . [DISCONTINUED] losartan (COZAAR) 50 MG tablet Take 2 tablets by mouth daily.   No facility-administered encounter medications on file as of 06/07/2020.    ALLERGIES:  Allergies  Allergen Reactions  . Penicillins Rash    Did it involve swelling of the face/tongue/throat, SOB, or low BP? No Did it involve sudden or severe rash/hives, skin peeling, or any reaction on the inside of your mouth or nose? No Did you need to seek medical attention at a hospital or doctor's office? No When did it last happen?40+ years ago If all above answers are "NO", may proceed with cephalosporin use.      PHYSICAL EXAM:  ECOG Performance status: 1  Vitals:   06/07/20 1454 06/07/20 1455  BP: (!) 168/87 (!) 169/79  Pulse: 78   Resp: 18   Temp: 97.7 F (36.5 C)   SpO2: 100%    Filed Weights   06/07/20 1454  Weight: 158 lb 14.4 oz (72.1 kg)    Physical Exam Vitals reviewed.  Constitutional:      Appearance: Normal appearance.  Cardiovascular:     Rate and Rhythm: Normal rate and regular rhythm.     Heart sounds: Normal heart sounds.  Pulmonary:     Effort: Pulmonary effort is normal.     Breath sounds: Normal breath sounds.  Abdominal:     General: There is no distension.     Palpations: Abdomen is soft. There is no mass.   Musculoskeletal:        General: No swelling.  Lymphadenopathy:     Cervical: No cervical adenopathy.  Skin:    General: Skin is warm.  Neurological:     General: No focal deficit present.     Mental Status: She is alert and oriented to person, place, and time.  Psychiatric:        Mood and Affect: Mood normal.        Behavior: Behavior normal.    Breast exam did not reveal any palpable masses. No  regional adenopathy  LABORATORY DATA:  I have reviewed the labs as listed.  CBC    Component Value Date/Time   WBC 3.6 (L) 05/17/2020 0940   RBC 4.83 05/17/2020 0940   HGB 12.7 05/17/2020 0940   HCT 40.6 05/17/2020 0940   PLT 225 05/17/2020 0940   MCV 84.1 05/17/2020 0940   MCH 26.3 05/17/2020 0940   MCHC 31.3 05/17/2020 0940   RDW 13.8 05/17/2020 0940   LYMPHSABS 1.7 05/17/2020 0940   MONOABS 0.3 05/17/2020 0940   EOSABS 0.1 05/17/2020 0940   BASOSABS 0.0 05/17/2020 0940   CMP Latest Ref Rng & Units 05/17/2020 04/25/2019 09/13/2015  Glucose 70 - 99 mg/dL 113(H) 94 -  BUN 8 - 23 mg/dL 17 20 -  Creatinine 0.44 - 1.00 mg/dL 0.89 0.85 0.90  Sodium 135 - 145 mmol/L 137 137 -  Potassium 3.5 - 5.1 mmol/L 4.2 4.4 -  Chloride 98 - 111 mmol/L 107 106 -  CO2 22 - 32 mmol/L 23 23 -  Calcium 8.9 - 10.3 mg/dL 9.4 9.3 -  Total Protein 6.5 - 8.1 g/dL 7.3 7.3 -  Total Bilirubin 0.3 - 1.2 mg/dL 0.7 0.8 -  Alkaline Phos 38 - 126 U/L 47 64 -  AST 15 - 41 U/L 18 19 -  ALT 0 - 44 U/L 21 23 -       DIAGNOSTIC IMAGING:  I have independently reviewed the scans and discussed with the patient.    ASSESSMENT & PLAN:   1.  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 -Tumor was 1.6 cm, grade 3, 1 - sentinel lymph node.  No lymphovascular invasion. -FEC x4 cycles followed by dose dense Taxotere and Navelbine for 4 cycles completed on 08/21/2005. -XRT of the right breast completed. -Today she denies any new  onset pains.  Breast exam did not show any palpable masses. --Last mammogram in Dec 2021 BI RADS category 1 --Labs showed a WBC count of 3.6, Hb 12.7, platelets of 225 K. Mammogram ordered for Dec 2022  2.  Vitamin B12 deficiency:  -Resolved. She can stop taking B12 for now or take it once or twice a week.  Will reassess during her next visit No problem-specific Assessment & Plan notes found for this encounter.      Orders placed this encounter:  No orders of the defined types were placed in this encounter.  Benay Pike MD

## 2020-06-07 NOTE — Patient Instructions (Signed)
[facility] at [facility] Discharge Instructions  You were seen today by Dr. Iruku. Follow up as scheduled.   Thank you for choosing [facility] at [facility] to provide your oncology and hematology care.  To afford each patient quality time with our provider, please arrive at least 15 minutes before your scheduled appointment time.   If you have a lab appointment with the Cancer Center please come in thru the Main Entrance and check in at the main information desk.  You need to re-schedule your appointment should you arrive 10 or more minutes late.  We strive to give you quality time with our providers, and arriving late affects you and other patients whose appointments are after yours.  Also, if you no show three or more times for appointments you may be dismissed from the clinic at the providers discretion.     Again, thank you for choosing [facility].  Our hope is that these requests will decrease the amount of time that you wait before being seen by our physicians.       _____________________________________________________________  Should you have questions after your visit to [facility], please contact our office at [phone] and follow the prompts.  Our office hours are 8:00 a.m. and 4:30 p.m. Monday - Friday.  Please note that voicemails left after 4:00 p.m. may not be returned until the following business day.  We are closed weekends and major holidays.  You do have access to a nurse 24-7, just call the main number to the clinic [phone] and do not press any options, hold on the line and a nurse will answer the phone.    For prescription refill requests, have your pharmacy contact our office and allow 72 hours.    Due to Covid, you will need to wear a mask upon entering the hospital. If you do not have a mask, a mask will be given to you at the Main Entrance upon arrival. For doctor visits, patients may  have 1 support person age 18 or older with them. For treatment visits, patients can not have anyone with them due to social distancing guidelines and our immunocompromised population.     

## 2020-08-17 ENCOUNTER — Other Ambulatory Visit: Payer: Self-pay

## 2020-08-17 ENCOUNTER — Ambulatory Visit (INDEPENDENT_AMBULATORY_CARE_PROVIDER_SITE_OTHER): Payer: Medicare Other | Admitting: Gastroenterology

## 2020-08-17 ENCOUNTER — Encounter: Payer: Self-pay | Admitting: Gastroenterology

## 2020-08-17 VITALS — BP 131/65 | HR 76 | Temp 97.5°F | Ht 67.0 in | Wt 164.6 lb

## 2020-08-17 DIAGNOSIS — R159 Full incontinence of feces: Secondary | ICD-10-CM | POA: Diagnosis not present

## 2020-08-17 NOTE — Progress Notes (Signed)
Referring Provider: Sharilyn Sites, MD Primary Care Physician:  Sharilyn Sites, MD Primary GI: Dr. Gala Romney   Chief Complaint  Patient presents with  . Follow-up    No diarrhea/constipation. Doing okay    HPI:   Michaela Shaffer is a 75 y.o. female presenting today with a history of fecal seepage/incontinence, known lax anal sphincter tone, history of small adenoma last year but no surveillance due to age. Here for routine follow-up.    Metamucil daily, sometimes forgets. Takes daily when she thinks about it. BM every 2 days. Seepage resolved.   Milk and ice cream give gas. Onions give gas. Started lactose milk which helped. Fried chicken will sometimes cause looser stool. No abdominal pain, N/V. Good appetite. No GERD or dysphagia.    Past Medical History:  Diagnosis Date  . Breast CA ([place]) 2006   s/p chemo,rad [name]  . Constipation   . Diabetes mellitus   . Diverticula of colon    L side  . Fecal incontinence   . Hyperlipidemia   . Hypertension   . 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 S/P colonoscopy Dec 2005, Nov 2010   2005: normal, 2010: lax sphincter tone, left-sided diverticula     Past Surgical History:  Procedure Laterality Date  . BREAST BIOPSY Right 2014  . COLONOSCOPY  05/16/2004   Dr.Rourk- normal rectum, normal colon.  . COLONOSCOPY  05/02/2009   Dr.Rourk- lax sphincter tone, o/w normal rectum, few scattered L sided diverticula  . COLONOSCOPY N/A 02/02/2019   Diverticulosis in sigmoid and descending colon, one 4 mm polyp in cecum. Tubular adenoma. No surveillance due to age.   Marland Kitchen PARTIAL HYSTERECTOMY    . POLYPECTOMY  02/02/2019   Procedure: POLYPECTOMY;  Surgeon: Daneil Dolin, MD;  Location: [facility];  Service: Endoscopy;;  . portacath placement and removal    . right lumpectomy      Current Outpatient Medications  Medication Sig Dispense Refill  . aspirin 81 MG tablet  Take 81 mg by mouth daily.    . Calcium Carbonate-Vitamin D 600-200 MG-UNIT TABS Take 1 tablet by mouth daily.    . metFORMIN (GLUCOPHAGE) 500 MG tablet Take 250 mg by mouth 2 (two) times daily with a meal.    . methylcellulose oral powder Take by mouth as needed.    . naproxen (NAPROSYN) 375 MG tablet Take 375 mg by mouth 2 (two) times daily.    . simvastatin (ZOCOR) 20 MG tablet Take 20 mg by mouth at bedtime.     . valsartan (DIOVAN) 320 MG tablet Take 320 mg by mouth daily.    . verapamil (CALAN-SR) 240 MG CR tablet Take 240 mg by mouth at bedtime.    . Misc. Devices MISC Please provide patient with mastectomy prothesis. 2 each 0  . Misc. Devices MISC Please provide patient with mastectomy bras. 7 each 0   No current facility-administered medications for this visit.    Allergies as of 08/17/2020 - Review Complete 08/17/2020  Allergen Reaction Noted  . Penicillins Rash     Family History  Problem Relation Age of Onset  . Diabetes Mother   . Stroke Father   . Cancer Sister   . Colon cancer Neg Hx   . Colon polyps Neg Hx     Social History   Socioeconomic History  . Marital status: Married    Spouse name: Not  on file  . Number of children: Not on file  . Years of education: Not on file  . Highest education level: Not on file  Occupational History  . Occupation: retired  Tobacco Use  . Smoking status: Never Smoker  . Smokeless tobacco: Never Used  Vaping Use  . Vaping Use: Never used  Substance and Sexual Activity  . Alcohol use: No  . Drug use: No  . Sexual activity: Not on file  Other Topics Concern  . Not on file  Social History Narrative  . Not on file   Social Determinants of Health   Financial Resource Strain: Not on file  Food Insecurity: Not on file  Transportation Needs: Not on file  Physical Activity: Not on file  Stress: Not on file  Social Connections: Not on file    Review of Systems: Gen: Denies fever, chills, anorexia. Denies fatigue,  weakness, weight loss.  CV: Denies chest pain, palpitations, syncope, peripheral edema, and claudication. Resp: Denies dyspnea at rest, cough, wheezing, coughing up blood, and pleurisy. GI: see HPI Derm: Denies rash, itching, dry skin Psych: Denies depression, anxiety, memory loss, confusion. No homicidal or suicidal ideation.  Heme: Denies bruising, bleeding, and enlarged lymph nodes.  Physical Exam: BP 131/65   Pulse 76   Temp (!) 97.5 F (36.4 C)   Ht 5\' 7"  (1.702 m)   Wt 164 lb 9.6 oz (74.7 kg)   BMI 25.78 kg/m  General:   Alert and oriented. No distress noted. Pleasant and cooperative.  Head:  Normocephalic and atraumatic. Eyes:  Conjuctiva clear without scleral icterus. Mouth:  Mask in place Abdomen:  +BS, soft, non-tender and non-distended. No rebound or guarding. No HSM or masses noted. Msk:  Symmetrical without gross deformities. Normal posture. Extremities:  Without edema. Neurologic:  Alert and  oriented x4 Psych:  Alert and cooperative. Normal mood and affect.  ASSESSMENT: Michaela Shaffer is a 75 y.o. female presenting today with history of fecal seepage/incontinence, known lax sphincter tone, colonoscopy up-to-date, doing quite well on added Metamucil daily. She has noted occasional gas with milk products and avoiding this and/or using lactose-free products.   As she is doing well, will see her back in 1 year.   PLAN:  Continue Metamucil daily Return in 1 year Call if any concerns   Michaela Shaffer Needs, PhD, ANP-BC [facility]

## 2020-08-17 NOTE — Patient Instructions (Signed)
I am glad you are doing well! Continue Metamucil.  You can take gas-x for gas if needed. Keep avoiding dairy products and use lactose-free products if possible.  We will see you in 1 year or sooner if needed!   I enjoyed seeing you again today! As you know, I value our relationship and want to provide genuine, compassionate, and quality care. I welcome your feedback. If you receive a survey regarding your visit,  I greatly appreciate you taking time to fill this out. See you next time!  [name], PhD, ANP-BC [facility]

## 2021-05-29 ENCOUNTER — Other Ambulatory Visit (HOSPITAL_COMMUNITY): Payer: Medicare Other

## 2021-05-30 ENCOUNTER — Other Ambulatory Visit (HOSPITAL_COMMUNITY): Payer: Self-pay | Admitting: *Deleted

## 2021-05-30 DIAGNOSIS — E538 Deficiency of other specified B group vitamins: Secondary | ICD-10-CM

## 2021-05-30 DIAGNOSIS — C50911 Malignant neoplasm of unspecified site of right female breast: Secondary | ICD-10-CM

## 2021-05-31 ENCOUNTER — Inpatient Hospital Stay (HOSPITAL_COMMUNITY): Payer: Medicare Other | Attending: Hematology

## 2021-05-31 ENCOUNTER — Other Ambulatory Visit: Payer: Self-pay

## 2021-05-31 ENCOUNTER — Ambulatory Visit (HOSPITAL_COMMUNITY)
Admission: RE | Admit: 2021-05-31 | Discharge: 2021-05-31 | Disposition: A | Discharge status: Home / Self Care | Payer: Medicare Other | Source: Ambulatory Visit | Attending: Hematology | Admitting: Hematology

## 2021-05-31 DIAGNOSIS — Z79899 Other long term (current) drug therapy: Secondary | ICD-10-CM | POA: Diagnosis not present

## 2021-05-31 DIAGNOSIS — E538 Deficiency of other specified B group vitamins: Secondary | ICD-10-CM | POA: Diagnosis not present

## 2021-05-31 DIAGNOSIS — Z853 Personal history of malignant neoplasm of breast: Secondary | ICD-10-CM | POA: Diagnosis not present

## 2021-05-31 DIAGNOSIS — Z1231 Encounter for screening mammogram for malignant neoplasm of breast: Secondary | ICD-10-CM | POA: Diagnosis present

## 2021-05-31 DIAGNOSIS — C50911 Malignant neoplasm of unspecified site of right female breast: Secondary | ICD-10-CM

## 2021-05-31 LAB — COMPREHENSIVE METABOLIC PANEL
ALT: 26 U/L (ref 0–44)
AST: 21 U/L (ref 15–41)
Albumin: 4.2 g/dL (ref 3.5–5.0)
Alkaline Phosphatase: 57 U/L (ref 38–126)
Anion gap: 9 (ref 5–15)
BUN: 21 mg/dL (ref 8–23)
CO2: 22 mmol/L (ref 22–32)
Calcium: 9.5 mg/dL (ref 8.9–10.3)
Chloride: 107 mmol/L (ref 98–111)
Creatinine, Ser: 0.93 mg/dL (ref 0.44–1.00)
GFR, Estimated: >60 mL/min (ref >60)
Glucose, Bld: 92 mg/dL (ref 70–99)
Potassium: 4.2 mmol/L (ref 3.5–5.1)
Sodium: 138 mmol/L (ref 135–145)
Total Bilirubin: 0.5 mg/dL (ref 0.3–1.2)
Total Protein: 7.2 g/dL (ref 6.5–8.1)

## 2021-05-31 LAB — CBC WITH DIFFERENTIAL/PLATELET
Abs Immature Granulocytes: 0.01 10*3/uL (ref 0.00–0.07)
Basophils Absolute: 0 10*3/uL (ref 0.0–0.1)
Basophils Relative: 1 %
Eosinophils Absolute: 0.1 10*3/uL (ref 0.0–0.5)
Eosinophils Relative: 3 %
HCT: 39.2 % (ref 36.0–46.0)
Hemoglobin: 12.7 g/dL (ref 12.0–15.0)
Immature Granulocytes: 0 %
Lymphocytes Relative: 47 %
Lymphs Abs: 2 10*3/uL (ref 0.7–4.0)
MCH: 27.3 pg (ref 26.0–34.0)
MCHC: 32.4 g/dL (ref 30.0–36.0)
MCV: 84.1 fL (ref 80.0–100.0)
Monocytes Absolute: 0.4 10*3/uL (ref 0.1–1.0)
Monocytes Relative: 9 %
Neutro Abs: 1.7 10*3/uL (ref 1.7–7.7)
Neutrophils Relative %: 40 %
Platelets: 225 10*3/uL (ref 150–400)
RBC: 4.66 MIL/uL (ref 3.87–5.11)
RDW: 13.9 % (ref 11.5–15.5)
WBC: 4.3 10*3/uL (ref 4.0–10.5)
nRBC: 0 % (ref 0.0–0.2)

## 2021-05-31 LAB — VITAMIN B12: Vitamin B-12: 248 pg/mL (ref 180–914)

## 2021-06-04 NOTE — Progress Notes (Signed)
[facility] [address] [place] [address]   Patient Care Team: Michaela Sites, MD as PCP - General (Family Medicine) Michaela Romney Cristopher Estimable, MD (Gastroenterology)  SUMMARY OF ONCOLOGIC HISTORY: Oncology History  Infiltrating ductal carcinoma of breast ([place])  02/13/2005 Initial Diagnosis   Infiltrating ductal carcinoma of breast   03/10/2005 Surgery   Right lumpectomy demonstrating a 1.6 cm invasive ductal carcinoma involving the deep margin with 0/1 lymph nodes for disease.  ER 0%, PR 0%, Her2 negative, Ki-67 38%   03/19/2005 Surgery   Re-excision of deep margin showing no residula cancer   04/15/2005 - 06/19/2005 Chemotherapy   FEC x 4 cycles   07/10/2005 - 08/21/2005 Chemotherapy   Navelbine/Taxotere x 4 cycles   09/22/2005 - 11/06/2005 Radiation Therapy   [name]   09/13/2015 Imaging   CT chest performed by PCP for cough- Negative CT scan of the chest.     CHIEF COMPLIANT: Follow-up for right breast cancer and B12 deficiency   INTERVAL HISTORY: [name] is a 75 y.o. female here today for follow up of her right breast cancer and B12 deficiency. Her last visit was on 06/07/2020 by Dr. Chryl Shaffer.   Today she reports feeling good.   REVIEW OF SYSTEMS:   Review of Systems  Constitutional:  Negative for appetite change and fatigue (75%).  All other systems reviewed and are negative.  I have reviewed the past medical history, past surgical history, social history and family history with the patient and they are unchanged from previous note.   ALLERGIES:   is allergic to penicillins.   MEDICATIONS:  Current Outpatient Medications  Medication Sig Dispense Refill   aspirin 81 MG tablet Take 81 mg by mouth daily.     Calcium Carbonate-Vitamin D 600-200 MG-UNIT TABS Take 1 tablet by mouth daily.     metFORMIN (GLUCOPHAGE) 500 MG tablet Take 250 mg by mouth 2 (two) times daily with a meal.     methylcellulose oral powder Take by mouth as needed.     Misc.  Devices MISC Please provide patient with mastectomy prothesis. 2 each 0   Misc. Devices MISC Please provide patient with mastectomy bras. 7 each 0   naproxen (NAPROSYN) 375 MG tablet Take 375 mg by mouth 2 (two) times daily.     simvastatin (ZOCOR) 20 MG tablet Take 20 mg by mouth at bedtime.      valsartan (DIOVAN) 320 MG tablet Take 320 mg by mouth daily.     verapamil (CALAN-SR) 240 MG CR tablet Take 240 mg by mouth at bedtime.     No current facility-administered medications for this visit.     PHYSICAL EXAMINATION: Performance status (ECOG): 1 - Symptomatic but completely ambulatory  Vitals:   06/05/21 0958  BP: (!) 141/73  Pulse: 70  Resp: 17  Temp: (!) 96.8 F (36 C)  SpO2: 99%   Wt Readings from Last 3 Encounters:  06/05/21 164 lb (74.4 kg)  08/17/20 164 lb 9.6 oz (74.7 kg)  06/07/20 158 lb 14.4 oz (72.1 kg)   Physical Exam Vitals reviewed.  Constitutional:      Appearance: Normal appearance.  Cardiovascular:     Rate and Rhythm: Normal rate and regular rhythm.     Pulses: Normal pulses.     Heart sounds: Normal heart sounds.  Pulmonary:     Effort: Pulmonary effort is normal.     Breath sounds: Normal breath sounds.  Chest:  Breasts:  Right: Normal. No swelling, bleeding, inverted nipple, mass, nipple discharge, skin change or tenderness.     Left: Normal. No swelling, bleeding, inverted nipple, mass, nipple discharge, skin change (lumpectomy scar in medial quadrant unchanged) or tenderness.  Neurological:     General: No focal deficit present.     Mental Status: She is alert and oriented to person, place, and time.  Psychiatric:        Mood and Affect: Mood normal.        Behavior: Behavior normal.    Breast Exam Chaperone: Thana Ates     LABORATORY DATA:  I have reviewed the data as listed CMP Latest Ref Rng & Units 05/31/2021 05/17/2020 04/25/2019  Glucose 70 - 99 mg/dL 92 113(H) 94  BUN 8 - 23 mg/dL _0 Creatinine 0.44 - 1.00 mg/dL 0.93  0.89 0.85  Sodium 135 - 145 mmol/L 138 137 137  Potassium 3.5 - 5.1 mmol/L 4.2 4.2 4.4  Chloride 98 - 111 mmol/L 107 107 106  CO2 22 - 32 mmol/L _1 Calcium 8.9 - 10.3 mg/dL 9.5 9.4 9.3  Total Protein 6.5 - 8.1 g/dL 7.2 7.3 7.3  Total Bilirubin 0.3 - 1.2 mg/dL 0.5 0.7 0.8  Alkaline Phos 38 - 126 U/L 57 47 64  AST 15 - 41 U/L _2 ALT 0 - 44 U/L _3 No results found for: BLT903 Lab Results  Component Value Date   WBC 4.3 05/31/2021   HGB 12.7 05/31/2021   HCT 39.2 05/31/2021   MCV 84.1 05/31/2021   PLT 225 05/31/2021   NEUTROABS 1.7 05/31/2021    ASSESSMENT:  1.  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 -Tumor was 1.6 cm, grade 3, 1 - sentinel lymph node.  No lymphovascular invasion. -FEC x4 cycles followed by dose dense Taxotere and Navelbine for 4 cycles completed on 08/21/2005. -XRT of the right breast completed. -Today she denies any new onset pains.  Breast exam did not show any palpable masses. --Last mammogram in Dec 2021 BI RADS category 1 --Labs showed a WBC count of 3.6, Hb 12.7, platelets of 225 K. Mammogram ordered for Dec 2022     PLAN:  1.  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  No palpable masses in bilateral breast. - Reviewed labs from 05/31/2021 which showed normal chemistries and CBC. - Mammogram on 05/31/2021 reviewed by me was BI-RADS Category 1. - RTC 1 year for follow-up after mammogram and labs.     2.  Vitamin B12 deficiency: - Vitamin B12 is 248 with normal hemoglobin. - She is not on any B12 supplements at this time.   Breast Cancer therapy associated bone loss: I have recommended calcium, Vitamin D and weight bearing exercises.  Orders placed this encounter:  No orders of the defined types were placed in this  encounter.   The patient has a good understanding of the overall plan. She agrees with it. She will call with any problems that may develop before the next visit here.  Derek Jack, MD [facility] [phone]   I, Thana Ates, am acting as a scribe for Dr. Derek Jack.  I, Derek Jack MD, have reviewed the above documentation for accuracy and completeness, and I agree  with the above.

## 2021-06-05 ENCOUNTER — Other Ambulatory Visit: Payer: Self-pay

## 2021-06-05 ENCOUNTER — Inpatient Hospital Stay (HOSPITAL_COMMUNITY): Payer: Medicare Other | Admitting: Hematology

## 2021-06-05 VITALS — BP 141/73 | HR 70 | Temp 96.8°F | Resp 17 | Wt 164.0 lb

## 2021-06-05 DIAGNOSIS — Z853 Personal history of malignant neoplasm of breast: Secondary | ICD-10-CM | POA: Diagnosis not present

## 2021-06-05 DIAGNOSIS — C50911 Malignant neoplasm of unspecified site of right female breast: Secondary | ICD-10-CM | POA: Diagnosis not present

## 2021-06-05 NOTE — Patient Instructions (Addendum)
[facility] at [facility] Discharge Instructions   You were seen and examined today by Dr. Delton Coombes. He reviewed your lab work today which is normal/stable.  Return as scheduled in 1 year.     Thank you for choosing [facility] at [facility] to provide your oncology and hematology care.  To afford each patient quality time with our provider, please arrive at least 15 minutes before your scheduled appointment time.   If you have a lab appointment with the [facility] please come in thru the Main Entrance and check in at the main information desk.  You need to re-schedule your appointment should you arrive 10 or more minutes late.  We strive to give you quality time with our providers, and arriving late affects you and other patients whose appointments are after yours.  Also, if you no show three or more times for appointments you may be dismissed from the clinic at the providers discretion.     Again, thank you for choosing [facility].  Our hope is that these requests will decrease the amount of time that you wait before being seen by our physicians.       _____________________________________________________________  Should you have questions after your visit to [facility], please contact our office at [phone] and follow the prompts.  Our office hours are 8:00 a.m. and 4:30 p.m. Monday - Friday.  Please note that voicemails left after 4:00 p.m. may not be returned until the following business day.  We are closed weekends and major holidays.  You do have access to a nurse 24-7, just call the main number to the clinic [phone] and do not press any options, hold on the line and a nurse will answer the phone.    For prescription refill requests, have your pharmacy contact our office and allow 72 hours.    Due to Covid, you will need to wear a mask upon entering the hospital. If you do not have a mask, a mask  will be given to you at the Main Entrance upon arrival. For doctor visits, patients may have 1 support person age 59 or older with them. For treatment visits, patients can not have anyone with them due to social distancing guidelines and our immunocompromised population.

## 2021-09-02 ENCOUNTER — Encounter: Payer: Self-pay | Admitting: Internal Medicine

## 2021-11-06 ENCOUNTER — Ambulatory Visit: Payer: Medicare Other | Admitting: Obstetrics & Gynecology

## 2021-11-06 ENCOUNTER — Encounter: Payer: Self-pay | Admitting: Obstetrics & Gynecology

## 2021-11-06 VITALS — BP 143/75 | HR 87 | Ht 67.0 in | Wt 156.4 lb

## 2021-11-06 DIAGNOSIS — N898 Other specified noninflammatory disorders of vagina: Secondary | ICD-10-CM

## 2021-11-06 NOTE — Progress Notes (Signed)
   GYN VISIT Patient name: Michaela Shaffer MRN [id number]  Date of birth: 06/07/1946 Chief Complaint:   knot inside vagina  History of Present Illness:   Michaela Shaffer is a 76 y.o. [id number] PM, [place] female being seen today for the following concerns:  -Last April using lubricant cream and felt a knot.  Knot still present, no change in size.  No discharge, draining or bleeding.   Seen by PCP who advised she come to see [place].  She denies any other acute complaints.  Denies urinary concerns.  Sexually active without issues  No LMP recorded. Patient has had a hysterectomy.     11/06/2021   10:31 AM 04/27/2015    2:27 PM  Depression screen PHQ 2/9  Decreased Interest 0 0  Down, Depressed, Hopeless 0 0  PHQ - 2 Score 0 0  Altered sleeping 0   Tired, decreased energy 0   Change in appetite 0   Feeling bad or failure about yourself  0   Trouble concentrating 0   Moving slowly or fidgety/restless 0   Suicidal thoughts 0   PHQ-9 Score 0      Review of Systems:   Pertinent items are noted in HPI Denies fever/chills, dizziness, headaches, visual disturbances, fatigue, shortness of breath, chest pain, abdominal pain, vomiting, Denies problems with bowel movements, urination, or intercourse unless otherwise stated above.  Pertinent History Reviewed:  Reviewed past medical,surgical, social, obstetrical and family history.  Reviewed problem list, medications and allergies. Physical Assessment:   Vitals:   11/06/21 1022  BP: (!) 143/75  Pulse: 87  Weight: 156 lb 6.4 oz (70.9 kg)  Height: '5\' 7"'$  (1.702 m)  Body mass index is 24.5 kg/m.       Physical Examination:   General appearance: alert, well appearing, and in no distress  Psych: mood appropriate, normal affect  Skin: warm & dry   Cardiovascular: normal heart rate noted  Respiratory: normal respiratory effort, no distress  Abdomen: soft, non-tender   Pelvic: VULVA: normal appearing vulva with no masses, tenderness or lesions, VAGINA:  normal appearing vagina with normal color and discharge, no lesions, exaggerated rugae fold right at introitus- no acute abnormalities noted.  Uterus and cervix surgically absent.  ADNEXA: normal adnexa in size, nontender and no masses  Extremities: no edema   Chaperone: Levy Pupa    Assessment & Plan:  1) Vaginal irritation -reassured pt of normal findings -no further management or work up indicated -f/u prn   No orders of the defined types were placed in this encounter.   Return as needed.   Janyth Pupa, DO Attending [place], [facility] for [employer], [place]

## 2021-11-20 ENCOUNTER — Encounter: Payer: Self-pay | Admitting: Gastroenterology

## 2021-11-20 ENCOUNTER — Ambulatory Visit (INDEPENDENT_AMBULATORY_CARE_PROVIDER_SITE_OTHER): Payer: Medicare Other | Admitting: Gastroenterology

## 2021-11-20 VITALS — BP 120/65 | HR 77 | Temp 98.7°F | Ht 67.0 in | Wt 156.7 lb

## 2021-11-20 DIAGNOSIS — R151 Fecal smearing: Secondary | ICD-10-CM

## 2021-11-20 NOTE — Patient Instructions (Signed)
I recommend adding Benefiber 2 teaspoons daily to the beverage of your choice.  Please call us if you have any rectal burning/itching, bleeding, prolapse.  We will see you back as needed!  I enjoyed seeing you again today! As you know, I value our relationship and want to provide genuine, compassionate, and quality care. I welcome your feedback. If you receive a survey regarding your visit,  I greatly appreciate you taking time to fill this out. See you next time!  [name], PhD, ANP-BC [facility]

## 2021-11-20 NOTE — Progress Notes (Signed)
Gastroenterology Office Note     Primary Care Physician:  Sharilyn Sites, MD  Primary Gastroenterologist: Dr. Gala Romney    Chief Complaint   Chief Complaint  Patient presents with   Constipation    Patient doing one year follow up. States no concerns today. States she was having constipation and loose stools but not back to normal. Usually has one stool daily to one every other day. Not having to take anything for constipation currently.      History of Present Illness   Michaela Shaffer is a 76 y.o. female presenting today in follow-up with a history of of fecal seepage/incontinence, known lax anal sphincter tone, history of small adenoma 2020 but no surveillance due to age.     Cut back on dairy and sweets, which has helped. One BM daily to every other day. Seepage of stool about every 3 days. Wears pads.  No rectal bleeding. Had itching and put vaseline on it with good results. Metamucil constipated her. Was taking daily to every other day. No known hemorrhoids on prior colonoscopy. She is not interested in pursuing any banding even if hemorrhoids present. She feels she is stable and does not want any further interventions.    Past Medical History:  Diagnosis Date   Breast CA [facility]) 2006   s/p chemo,rad [name]   Constipation    Diabetes mellitus    Diverticula of colon    L side   Fecal incontinence    Hyperlipidemia    Hypertension    Infiltrating ductal carcinoma of breast ([place]) 04/25/2009   Qualifier: History of  By: Zeb Comfort     S/P colonoscopy Dec 2005, Nov 2010   2005: normal, 2010: lax sphincter tone, left-sided diverticula     Past Surgical History:  Procedure Laterality Date   BREAST BIOPSY Right 2014   COLONOSCOPY  05/16/2004   Dr.Rourk- normal rectum, normal colon.   COLONOSCOPY  05/02/2009   Dr.Rourk- lax sphincter tone, o/w normal rectum, few scattered L sided diverticula   COLONOSCOPY N/A 02/02/2019   Diverticulosis in sigmoid and descending  colon, one 4 mm polyp in cecum. Tubular adenoma. No surveillance due to age.    PARTIAL HYSTERECTOMY     POLYPECTOMY  02/02/2019   Procedure: POLYPECTOMY;  Surgeon: Daneil Dolin, MD;  [facility];  Service: Endoscopy;;   portacath placement and removal     right lumpectomy      Current Outpatient Medications  Medication Sig Dispense Refill   aspirin 81 MG tablet Take 81 mg by mouth daily.     Calcium Carbonate-Vitamin D 600-200 MG-UNIT TABS Take 1 tablet by mouth daily.     metFORMIN (GLUCOPHAGE) 500 MG tablet Take 250 mg by mouth 2 (two) times daily with a meal.     Misc. Devices MISC Please provide patient with mastectomy prothesis. 2 each 0   Misc. Devices MISC Please provide patient with mastectomy bras. 7 each 0   simvastatin (ZOCOR) 20 MG tablet Take 20 mg by mouth at bedtime.      valsartan (DIOVAN) 320 MG tablet Take 320 mg by mouth daily.     verapamil (CALAN-SR) 240 MG CR tablet Take 240 mg by mouth at bedtime.     No current facility-administered medications for this visit.    Allergies as of 11/20/2021 - Review Complete 11/20/2021  Allergen Reaction Noted   Penicillins Rash     Family History  Problem Relation Age of Onset  Stroke Father    Diabetes Mother    Diabetes Brother    Diabetes Brother    Cancer Sister    Diabetes Daughter    Colon cancer Neg Hx    Colon polyps Neg Hx     Social History   Socioeconomic History   Marital status: Married    Spouse name: Not on file   Number of children: Not on file   Years of education: Not on file   Highest education level: Not on file  Occupational History   Occupation: retired  Tobacco Use   Smoking status: Never    Passive exposure: Never   Smokeless tobacco: Never  Vaping Use   Vaping Use: Never used  Substance and Sexual Activity   Alcohol use: No   Drug use: No   Sexual activity: Yes    Birth control/protection: Surgical    Comment: hyst  Other Topics Concern   Not on file   Social History Narrative   Not on file   Social Determinants of Health   Financial Resource Strain: Low Risk    Difficulty of Paying Living Expenses: Not hard at all  Food Insecurity: No Food Insecurity   Worried About Charity fundraiser in the Last Year: Never true   [place] in the Last Year: Never true  Transportation Needs: No Transportation Needs   Lack of Transportation (Medical): No   Lack of Transportation (Non-Medical): No  Physical Activity: Inactive   Days of Exercise per Week: 0 days   Minutes of Exercise per Session: 10 min  Stress: No Stress Concern Present   Feeling of Stress : Not at all  Social Connections: Socially Integrated   Frequency of Communication with Friends and Family: More than three times a week   Frequency of Social Gatherings with Friends and Family: Once a week   Attends Religious Services: More than 4 times per year   Active Member of [employer] or Organizations: Yes   Attends Music therapist: More than 4 times per year   Marital Status: Married  Human resources officer Violence: Not At Risk   Fear of Current or Ex-Partner: No   Emotionally Abused: No   Physically Abused: No   Sexually Abused: No     Review of Systems   Gen: Denies any fever, chills, fatigue, weight loss, lack of appetite.  CV: Denies chest pain, heart palpitations, peripheral edema, syncope.  Resp: Denies shortness of breath at rest or with exertion. Denies wheezing or cough.  GI: Denies dysphagia or odynophagia. Denies jaundice, hematemesis, fecal incontinence. GU : Denies urinary burning, urinary frequency, urinary hesitancy MS: Denies joint pain, muscle weakness, cramps, or limitation of movement.  Derm: Denies rash, itching, dry skin Psych: Denies depression, anxiety, memory loss, and confusion Heme: Denies bruising, bleeding, and enlarged lymph nodes.   Physical Exam   BP 120/65 (BP Location: Left Arm, Patient Position: Sitting, Cuff Size: Large)    Pulse 77   Temp 98.7 F (37.1 C) (Oral)   Ht '5\' 7"'$  (1.702 m)   Wt 156 lb 11.2 oz (71.1 kg)   BMI 24.54 kg/m  General:   Alert and oriented. Pleasant and cooperative. Well-nourished and well-developed.  Head:  Normocephalic and atraumatic. Eyes:  Without icterus Abdomen:  +BS, soft, non-tender and non-distended. No HSM noted. No guarding or rebound. No masses appreciated.  Rectal:  Deferred  Msk:  Symmetrical without gross deformities. Normal posture. Extremities:  Without edema. Neurologic:  Alert and  oriented x4;  grossly normal neurologically. Skin:  Intact without significant lesions or rashes. Psych:  Alert and cooperative. Normal mood and affect.   Assessment   Michaela Shaffer is a 76 y.o. female presenting today in follow-up with a history of fecal seepage multifactorial in setting of lax sphincter tone, constipation, returning for follow-up.  Metamucil has constipated her historically. She feels she is at baseline for symptoms and not interested in pursuing any other evaluations (e.g. evaluation for hemorrhoids, banding if appropriate).   I recommend taking Benefiber daily instead of Metamucil to see if this is more helpful. She would like to return prn.    PLAN    Add Benefiber instead Return prn No repeat colonoscopy due to age   [name], PhD, ANP-BC [facility]astroenter[facility]

## 2022-02-03 IMAGING — DX DG SHOULDER 2+V*L*
3 series · 3 of 3 positions shown · non-contrast
Comparison: None.

CLINICAL DATA: Left shoulder pain, fall with left shoulder injury

EXAM:
LEFT SHOULDER - 2+ VIEW

[shoulder grashey]
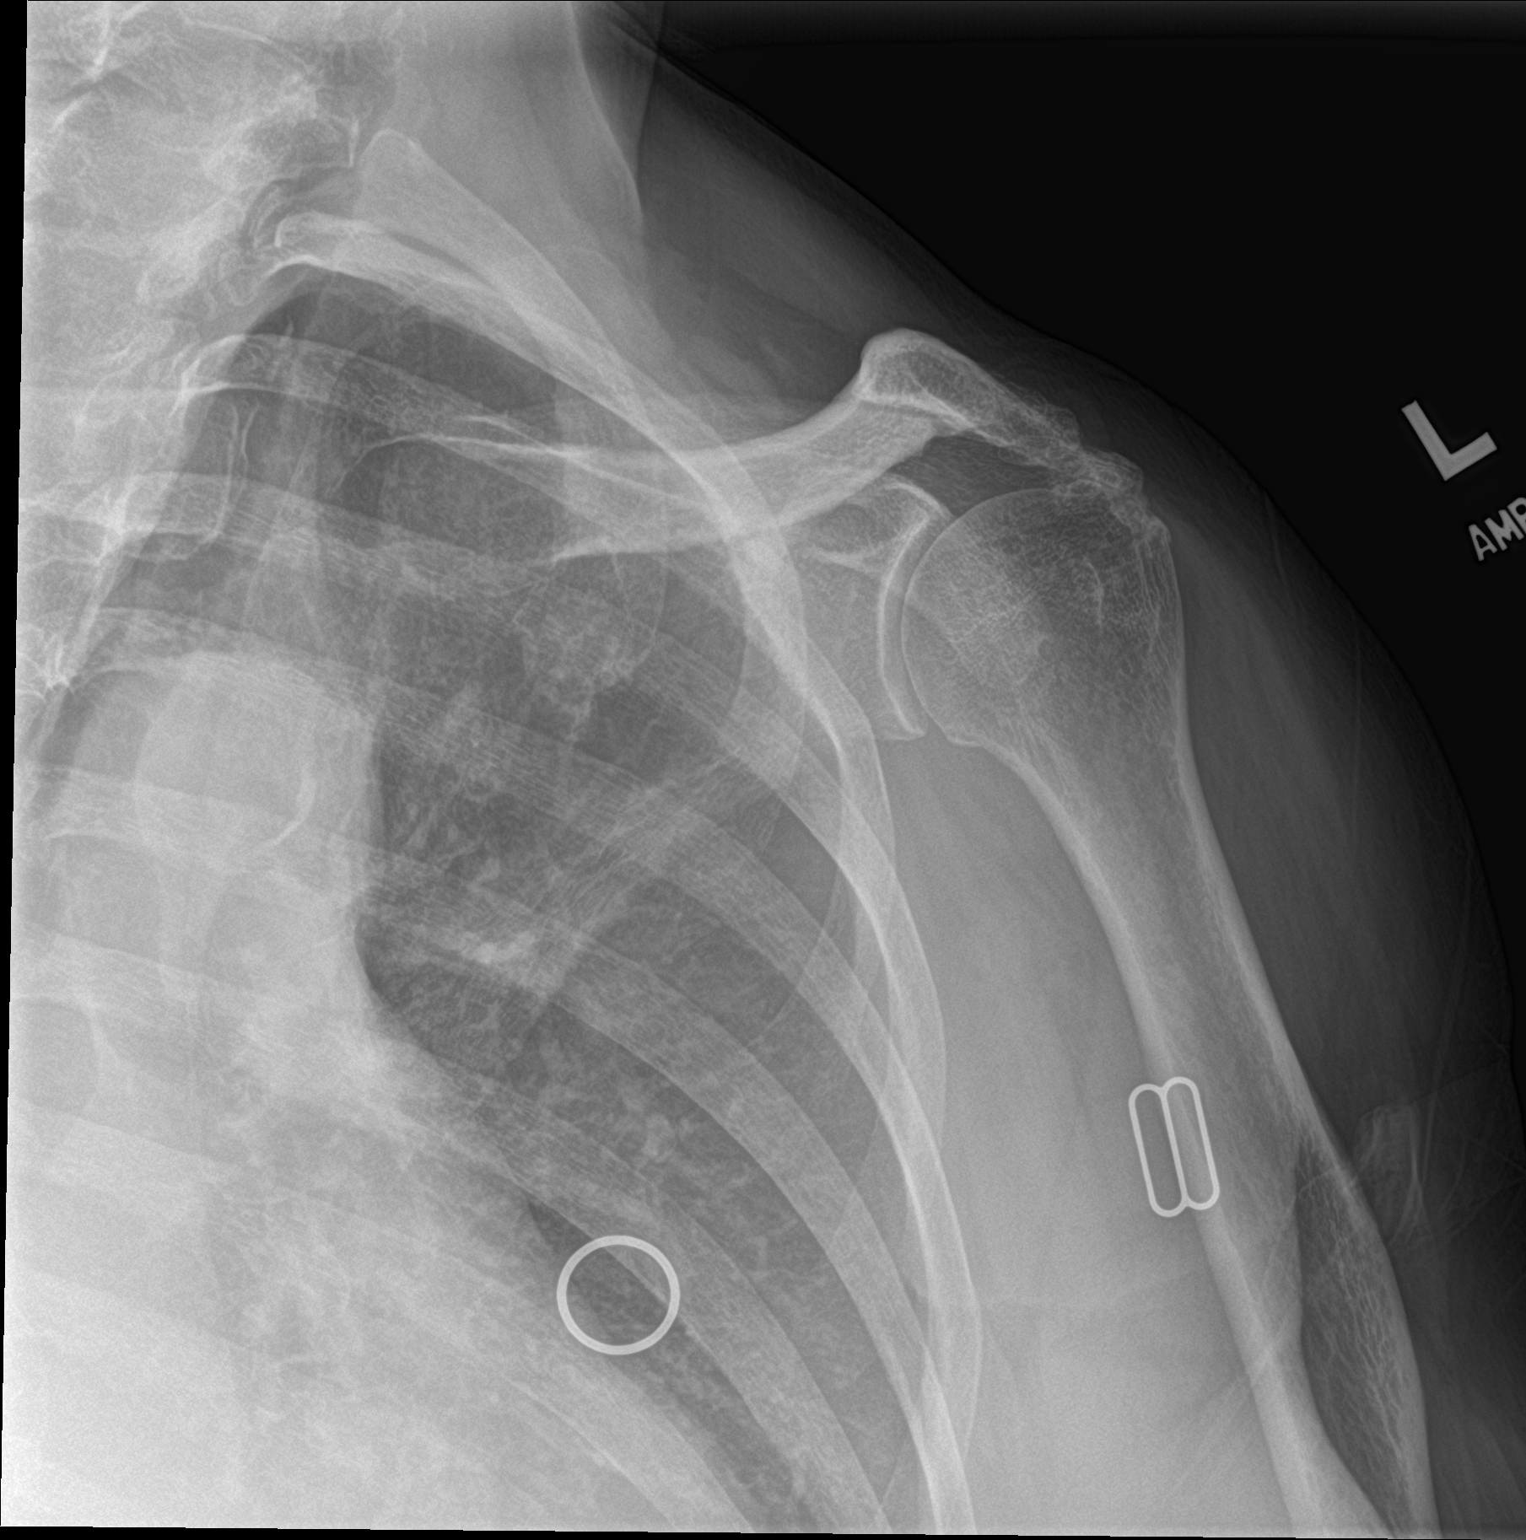

[shoulder y view]
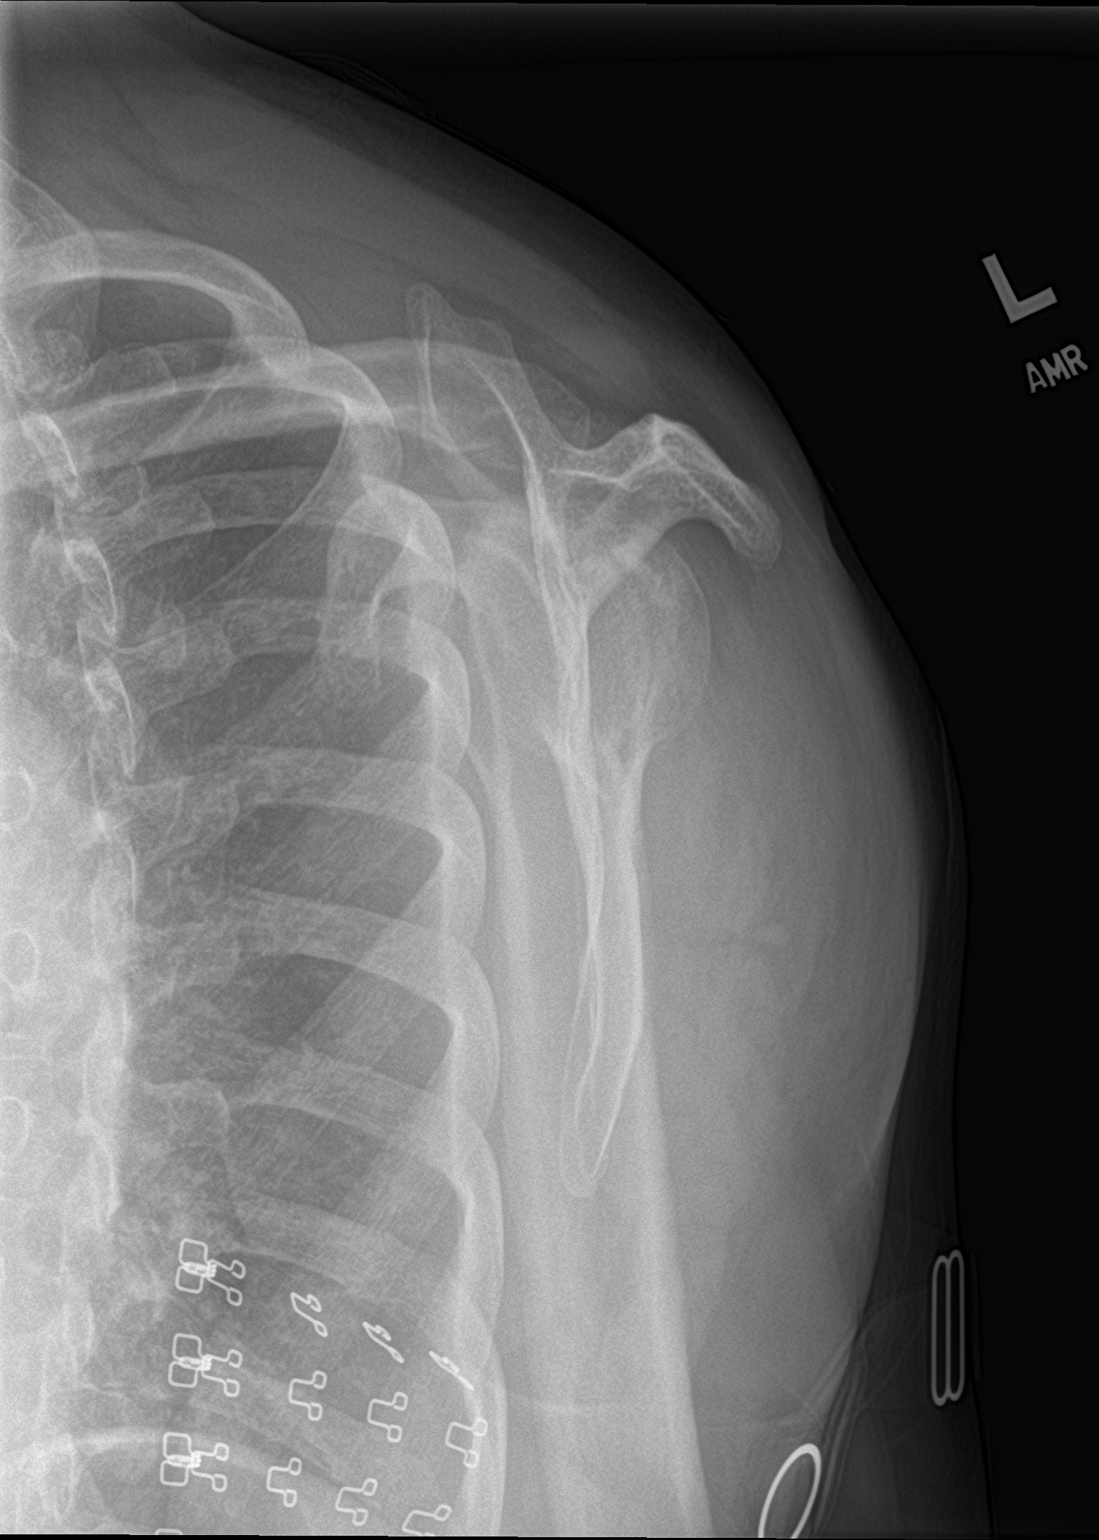

[shoulder axillary]
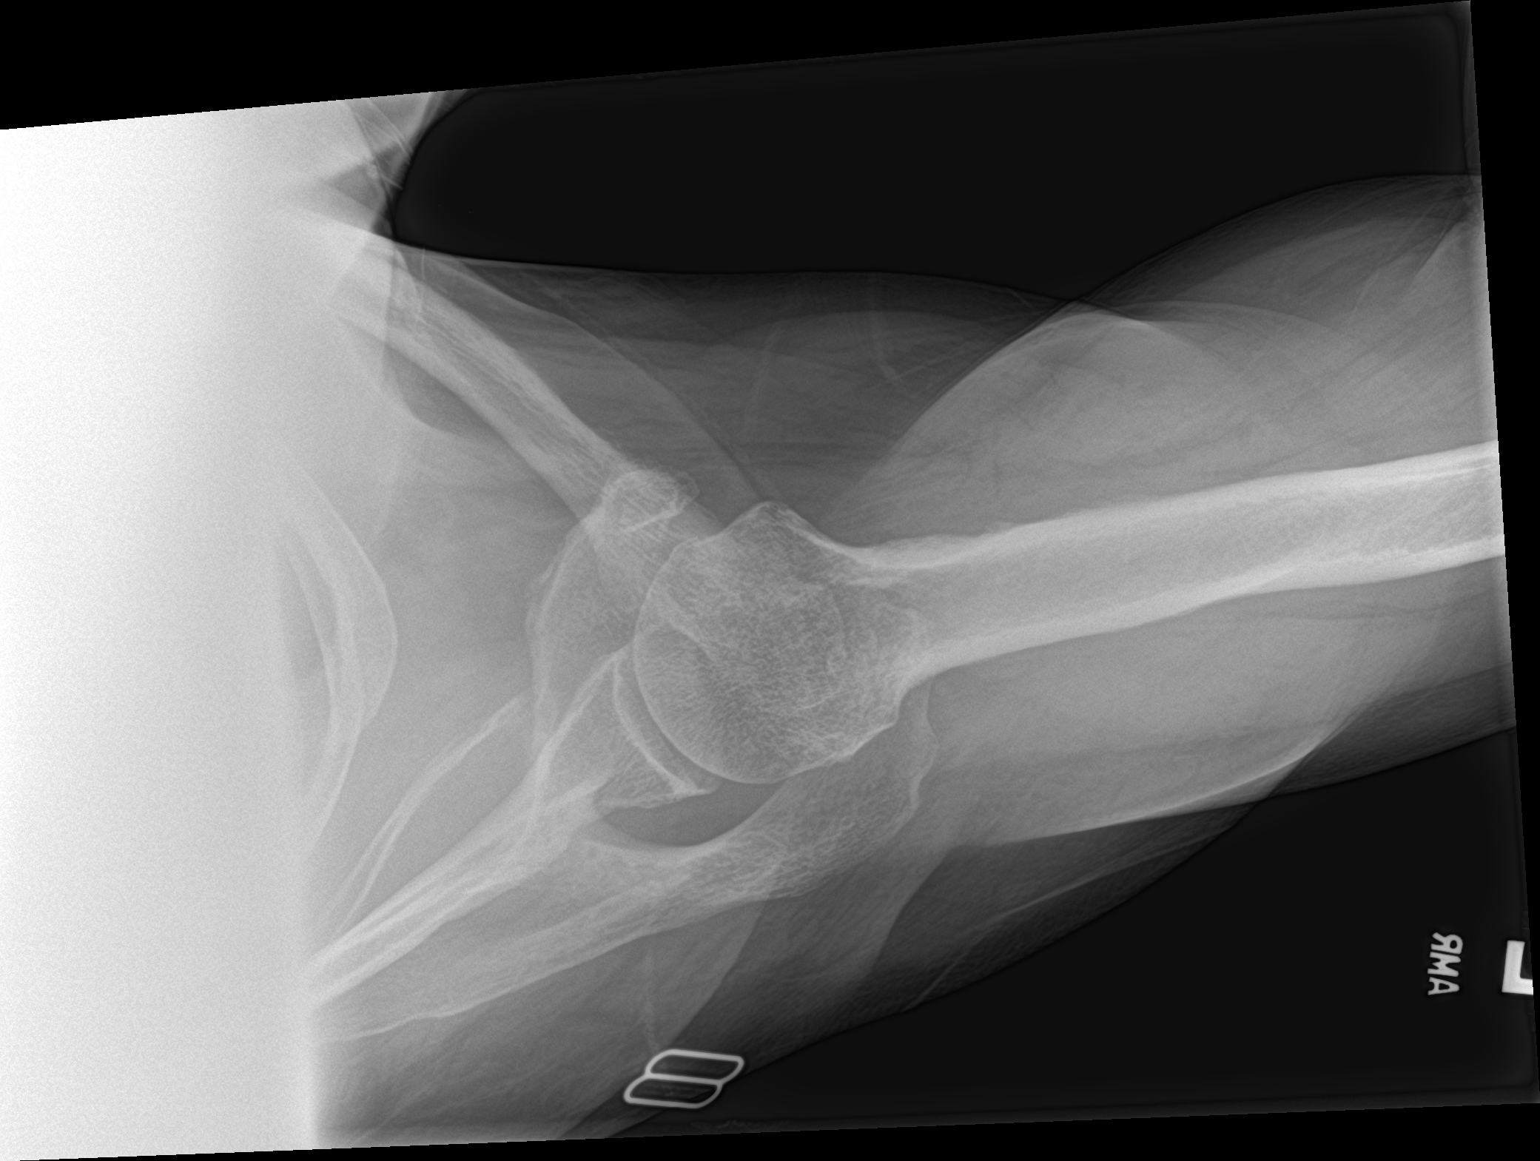

[3 of 3 positions shown; findings below may reference images not displayed]

FINDINGS: There is no evidence of fracture or dislocation. There is no
evidence of arthropathy or other focal bone abnormality. Soft
tissues are unremarkable.
IMPRESSION: Negative.

## 2022-06-02 ENCOUNTER — Inpatient Hospital Stay: Payer: Medicare Other

## 2022-06-02 ENCOUNTER — Ambulatory Visit (HOSPITAL_COMMUNITY)
Admission: RE | Admit: 2022-06-02 | Discharge: 2022-06-02 | Disposition: A | Discharge status: Home / Self Care | Payer: Medicare Other | Source: Ambulatory Visit | Attending: Hematology | Admitting: Hematology

## 2022-06-02 DIAGNOSIS — Z1231 Encounter for screening mammogram for malignant neoplasm of breast: Secondary | ICD-10-CM | POA: Diagnosis not present

## 2022-06-02 DIAGNOSIS — C50911 Malignant neoplasm of unspecified site of right female breast: Secondary | ICD-10-CM | POA: Insufficient documentation

## 2022-06-03 ENCOUNTER — Inpatient Hospital Stay: Payer: Medicare Other | Attending: Hematology

## 2022-06-03 DIAGNOSIS — Z853 Personal history of malignant neoplasm of breast: Secondary | ICD-10-CM | POA: Diagnosis present

## 2022-06-03 DIAGNOSIS — E538 Deficiency of other specified B group vitamins: Secondary | ICD-10-CM | POA: Diagnosis not present

## 2022-06-03 DIAGNOSIS — Z79899 Other long term (current) drug therapy: Secondary | ICD-10-CM | POA: Insufficient documentation

## 2022-06-03 DIAGNOSIS — C50911 Malignant neoplasm of unspecified site of right female breast: Secondary | ICD-10-CM

## 2022-06-03 LAB — COMPREHENSIVE METABOLIC PANEL
ALT: 22 U/L (ref 0–44)
AST: 20 U/L (ref 15–41)
Albumin: 3.9 g/dL (ref 3.5–5.0)
Alkaline Phosphatase: 53 U/L (ref 38–126)
Anion gap: 7 (ref 5–15)
BUN: 18 mg/dL (ref 8–23)
CO2: 23 mmol/L (ref 22–32)
Calcium: 9.1 mg/dL (ref 8.9–10.3)
Chloride: 109 mmol/L (ref 98–111)
Creatinine, Ser: 0.99 mg/dL (ref 0.44–1.00)
GFR, Estimated: 59 mL/min — ABNORMAL LOW (ref >60)
Glucose, Bld: 121 mg/dL — ABNORMAL HIGH (ref 70–99)
Potassium: 4 mmol/L (ref 3.5–5.1)
Sodium: 139 mmol/L (ref 135–145)
Total Bilirubin: 0.7 mg/dL (ref 0.3–1.2)
Total Protein: 6.9 g/dL (ref 6.5–8.1)

## 2022-06-03 LAB — CBC WITH DIFFERENTIAL/PLATELET
Abs Immature Granulocytes: 0.01 10*3/uL (ref 0.00–0.07)
Basophils Absolute: 0 10*3/uL (ref 0.0–0.1)
Basophils Relative: 1 %
Eosinophils Absolute: 0.1 10*3/uL (ref 0.0–0.5)
Eosinophils Relative: 3 %
HCT: 37.1 % (ref 36.0–46.0)
Hemoglobin: 11.8 g/dL — ABNORMAL LOW (ref 12.0–15.0)
Immature Granulocytes: 0 %
Lymphocytes Relative: 45 %
Lymphs Abs: 1.5 10*3/uL (ref 0.7–4.0)
MCH: 26.2 pg (ref 26.0–34.0)
MCHC: 31.8 g/dL (ref 30.0–36.0)
MCV: 82.4 fL (ref 80.0–100.0)
Monocytes Absolute: 0.3 10*3/uL (ref 0.1–1.0)
Monocytes Relative: 10 %
Neutro Abs: 1.3 10*3/uL — ABNORMAL LOW (ref 1.7–7.7)
Neutrophils Relative %: 41 %
Platelets: 199 10*3/uL (ref 150–400)
RBC: 4.5 MIL/uL (ref 3.87–5.11)
RDW: 14.3 % (ref 11.5–15.5)
WBC: 3.3 10*3/uL — ABNORMAL LOW (ref 4.0–10.5)
nRBC: 0 % (ref 0.0–0.2)

## 2022-06-03 LAB — VITAMIN B12: Vitamin B-12: 215 pg/mL (ref 180–914)

## 2022-06-11 ENCOUNTER — Inpatient Hospital Stay: Payer: Medicare Other | Admitting: Hematology

## 2022-06-11 VITALS — HR 72 | Temp 98.1°F | Resp 16 | Ht 67.0 in | Wt 159.7 lb

## 2022-06-11 DIAGNOSIS — E538 Deficiency of other specified B group vitamins: Secondary | ICD-10-CM

## 2022-06-11 DIAGNOSIS — Z1231 Encounter for screening mammogram for malignant neoplasm of breast: Secondary | ICD-10-CM | POA: Diagnosis not present

## 2022-06-11 DIAGNOSIS — C50911 Malignant neoplasm of unspecified site of right female breast: Secondary | ICD-10-CM

## 2022-06-11 DIAGNOSIS — Z9011 Acquired absence of right breast and nipple: Secondary | ICD-10-CM | POA: Diagnosis not present

## 2022-06-11 DIAGNOSIS — Z853 Personal history of malignant neoplasm of breast: Secondary | ICD-10-CM | POA: Diagnosis not present

## 2022-06-11 MED ORDER — MISC. DEVICES MISC: 0 refills | Status: AC

## 2022-06-11 NOTE — Patient Instructions (Addendum)
[place]  Discharge Instructions  You were seen and examined today by Dr. Delton Coombes.  Dr. Delton Coombes discussed your most recent lab work and mammogram which revealed that everything looks good.  Dr. Delton Coombes will get you scheduled for repeat mammogram after 06/03/2023.  Follow-up as scheduled in 1 year with labs.    Thank you for choosing [facility] to provide your oncology and hematology care.   To afford each patient quality time with our provider, please arrive at least 15 minutes before your scheduled appointment time. You may need to reschedule your appointment if you arrive late (10 or more minutes). Arriving late affects you and other patients whose appointments are after yours.  Also, if you miss three or more appointments without notifying the office, you may be dismissed from the clinic at the provider's discretion.    Again, thank you for choosing [facility].  Our hope is that these requests will decrease the amount of time that you wait before being seen by our physicians.   If you have a lab appointment with the [facility] please come in thru the Main Entrance and check in at the main information desk.           _____________________________________________________________  Should you have questions after your visit to [facility], please contact our office at [phone] and follow the prompts.  Our office hours are 8:00 a.m. to 4:30 p.m. Monday - Thursday and 8:00 a.m. to 2:30 p.m. Friday.  Please note that voicemails left after 4:00 p.m. may not be returned until the following business day.  We are closed weekends and all major holidays.  You do have access to a nurse 24-7, just call the main number to the clinic [phone] and do not press any options, hold on the line and a nurse will answer the phone.    For prescription refill requests, have your pharmacy contact our office  and allow 72 hours.    Masks are optional in the cancer centers. If you would like for your care team to wear a mask while they are taking care of you, please let them know. You may have one support person who is at least 76 years old accompany you for your appointments.

## 2022-06-11 NOTE — Progress Notes (Signed)
[facility] [address] [place] [address]   Patient Care Team: Sharilyn Sites, MD as PCP - General (Family Medicine) Gala Romney Cristopher Estimable, MD (Gastroenterology)  SUMMARY OF ONCOLOGIC HISTORY: Oncology History  Infiltrating ductal carcinoma of breast ([place])  02/13/2005 Initial Diagnosis   Infiltrating ductal carcinoma of breast   03/10/2005 Surgery   Right lumpectomy demonstrating a 1.6 cm invasive ductal carcinoma involving the deep margin with 0/1 lymph nodes for disease.  ER 0%, PR 0%, Her2 negative, Ki-67 38%   03/19/2005 Surgery   Re-excision of deep margin showing no residula cancer   04/15/2005 - 06/19/2005 Chemotherapy   FEC x 4 cycles   07/10/2005 - 08/21/2005 Chemotherapy   Navelbine/Taxotere x 4 cycles   09/22/2005 - 11/06/2005 Radiation Therapy   [name]   09/13/2015 Imaging   CT chest performed by PCP for cough- Negative CT scan of the chest.     CHIEF COMPLIANT: Follow-up for right breast cancer and B12 deficiency   INTERVAL HISTORY: [name] is a 76 y.o. female seen for follow-up of right breast cancer and B12 deficiency.  She has not been taking B12 tablet for the last couple of years.  Denies any new onset pains.  REVIEW OF SYSTEMS:   Review of Systems  Constitutional:  Negative for appetite change and fatigue (75%).  All other systems reviewed and are negative.   I have reviewed the past medical history, past surgical history, social history and family history with the patient and they are unchanged from previous note.   ALLERGIES:   is allergic to penicillins.   MEDICATIONS:  Current Outpatient Medications  Medication Sig Dispense Refill   aspirin 81 MG tablet Take 81 mg by mouth daily.     Calcium Carbonate-Vitamin D 600-200 MG-UNIT TABS Take 1 tablet by mouth daily.     metFORMIN (GLUCOPHAGE) 500 MG tablet Take 250 mg by mouth 2 (two) times daily with a meal.     Misc. Devices MISC Please provide patient with mastectomy  prothesis. 2 each 0   Misc. Devices MISC Please provide patient with mastectomy bras. 7 each 0   simvastatin (ZOCOR) 20 MG tablet Take 20 mg by mouth at bedtime.      valsartan (DIOVAN) 320 MG tablet Take 320 mg by mouth daily.     verapamil (CALAN-SR) 240 MG CR tablet Take 240 mg by mouth at bedtime.     No current facility-administered medications for this visit.     PHYSICAL EXAMINATION: Performance status (ECOG): 1 - Symptomatic but completely ambulatory  There were no vitals filed for this visit.  Wt Readings from Last 3 Encounters:  11/20/21 156 lb 11.2 oz (71.1 kg)  11/06/21 156 lb 6.4 oz (70.9 kg)  06/05/21 164 lb (74.4 kg)   Physical Exam Vitals reviewed.  Constitutional:      Appearance: Normal appearance.  Cardiovascular:     Rate and Rhythm: Normal rate and regular rhythm.     Pulses: Normal pulses.     Heart sounds: Normal heart sounds.  Pulmonary:     Effort: Pulmonary effort is normal.     Breath sounds: Normal breath sounds.  Chest:  Breasts:    Right: Normal. No swelling, bleeding, inverted nipple, mass, nipple discharge, skin change or tenderness.     Left: Normal. No swelling, bleeding, inverted nipple, mass, nipple discharge, skin change (lumpectomy scar in medial quadrant unchanged) or tenderness.  Neurological:     General: No  focal deficit present.     Mental Status: She is alert and oriented to person, place, and time.  Psychiatric:        Mood and Affect: Mood normal.        Behavior: Behavior normal.    Breast Exam Chaperone: Chasity Edwards   LABORATORY DATA:  I have reviewed the data as listed    Latest Ref Rng & Units 06/03/2022    9:14 AM 05/31/2021    9:54 AM 05/17/2020    9:40 AM  CMP  Glucose 70 - 99 mg/dL 121  92  113   BUN 8 - 23 mg/dL _0 Creatinine 0.44 - 1.00 mg/dL 0.99  0.93  0.89   Sodium 135 - 145 mmol/L 139  138  137   Potassium 3.5 - 5.1 mmol/L 4.0  4.2  4.2   Chloride 98 - 111 mmol/L 109  107  107   CO2 22  - 32 mmol/L _1 Calcium 8.9 - 10.3 mg/dL 9.1  9.5  9.4   Total Protein 6.5 - 8.1 g/dL 6.9  7.2  7.3   Total Bilirubin 0.3 - 1.2 mg/dL 0.7  0.5  0.7   Alkaline Phos 38 - 126 U/L 53  57  47   AST 15 - 41 U/L _2 ALT 0 - 44 U/L _3 No results found for: "QIO962" Lab Results  Component Value Date   WBC 3.3 (L) 06/03/2022   HGB 11.8 (L) 06/03/2022   HCT 37.1 06/03/2022   MCV 82.4 06/03/2022   PLT 199 06/03/2022   NEUTROABS 1.3 (L) 06/03/2022    ASSESSMENT:  1.  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 -Tumor was 1.6 cm, grade 3, 1 - sentinel lymph node.  No lymphovascular invasion. -FEC x4 cycles followed by dose dense Taxotere and Navelbine for 4 cycles completed on 08/21/2005. -XRT of the right breast completed. -Today she denies any new onset pains.  Breast exam did not show any palpable masses. --Last mammogram in Dec 2021 BI RADS category 1 --Labs showed a WBC count of 3.6, Hb 12.7, platelets of 225 K. Mammogram ordered for Dec 2022     PLAN:  1.  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  No palpable masses in bilateral breast.  No palpable adenopathy. - Labs from 06/03/2022 with normal LFTs.  CBC was grossly normal with mild leukopenia. - Mammogram on 06/02/2022 was BI-RADS Category 1. - RTC 1 year for follow-up with repeat labs and mammogram.   2.  Vitamin B12 deficiency: - She has been off of B12 supplements for the last 2 years.  B12 is trending down and is at 215. - We will repeat B12 at next visit.   Breast Cancer therapy associated bone loss: I have recommended calcium, Vitamin D and weight bearing exercises.  Orders placed this encounter:  No orders of the defined types were placed in this encounter.   The patient has a good understanding  of the overall plan. She agrees with it. She will call with any problems that may develop before the next visit here.  Derek Jack, MD [place] [phone]

## 2022-09-16 ENCOUNTER — Ambulatory Visit (HOSPITAL_COMMUNITY)
Admission: RE | Admit: 2022-09-16 | Discharge: 2022-09-16 | Disposition: A | Discharge status: Home / Self Care | Payer: Medicare Other | Source: Ambulatory Visit | Attending: Family Medicine | Admitting: Family Medicine

## 2022-09-16 ENCOUNTER — Other Ambulatory Visit (HOSPITAL_COMMUNITY): Payer: Self-pay | Admitting: Family Medicine

## 2022-09-16 DIAGNOSIS — S8991XA Unspecified injury of right lower leg, initial encounter: Secondary | ICD-10-CM | POA: Diagnosis not present

## 2022-09-16 DIAGNOSIS — M1711 Unilateral primary osteoarthritis, right knee: Secondary | ICD-10-CM | POA: Insufficient documentation

## 2023-01-27 ENCOUNTER — Ambulatory Visit: Payer: Medicare Other | Admitting: Obstetrics & Gynecology

## 2023-01-27 ENCOUNTER — Encounter: Payer: Self-pay | Admitting: Obstetrics & Gynecology

## 2023-01-27 VITALS — BP 139/73 | HR 79 | Ht 67.0 in | Wt 154.0 lb

## 2023-01-27 DIAGNOSIS — N8110 Cystocele, unspecified: Secondary | ICD-10-CM

## 2023-01-27 DIAGNOSIS — Z90710 Acquired absence of both cervix and uterus: Secondary | ICD-10-CM | POA: Diagnosis not present

## 2023-01-27 NOTE — Progress Notes (Signed)
GYN VISIT Patient name: Michaela Shaffer MRN [id number]  Date of birth: 15-Oct-1945 Chief Complaint:   Vaginal Prolapse  History of Present Illness:   Michaela Shaffer is a 77 y.o. G3P2012 PM, PH female being seen today for the following concerns:     Possible POP: When she washes feels like it's different down there.  Denies vaginal bleeding, itching, discharge or pain.  Denies pelvic or abdominal pain.  Feels like it's worse than it was previously.   Urinary concerns, no other acute complaints   No LMP recorded. Patient has had a hysterectomy.    Review of Systems:   Pertinent items are noted in HPI Denies fever/chills, dizziness, headaches, visual disturbances, fatigue, shortness of breath, chest pain, abdominal pain, vomiting, no problems with bowel movements, urination, or intercourse unless otherwise stated above.  Pertinent History Reviewed:   Past Surgical History:  Procedure Laterality Date   BREAST BIOPSY Right 2014   COLONOSCOPY  05/16/2004   Dr.Rourk- normal rectum, normal colon.   COLONOSCOPY  05/02/2009   Dr.Rourk- lax sphincter tone, o/w normal rectum, few scattered L sided diverticula   COLONOSCOPY N/A 02/02/2019   Diverticulosis in sigmoid and descending colon, one 4 mm polyp in cecum. Tubular adenoma. No surveillance due to age.    PARTIAL HYSTERECTOMY     POLYPECTOMY  02/02/2019   Procedure: POLYPECTOMY;  Surgeon: Corbin Ade, MD;  Location[facility];  Service: Endoscopy;;   portacath placement and removal     right lumpectomy      Past Medical History:  Diagnosis Date   Breast CA [facility]) 2006   s/p chemo,rad Dr. Mariel Sleet   Constipation    Diabetes mellitus    Diverticula of colon    L side   Fecal incontinence    Hyperlipidemia    Hypertension    Infiltrating ductal carcinoma of breast ([facility]) 04/25/2009   Qualifier: History of  By: Diana Eves     S/P colonoscopy Dec 2005, Nov 2010   2005: normal, 2010: lax sphincter tone, left-sided diverticula     Reviewed problem list, medications and allergies. Physical Assessment:   Vitals:   01/27/23 1524  BP: 139/73  Pulse: 79  Weight: 154 lb (69.9 kg)  Height: 5\' 7"  (1.702 m)  Body mass index is 24.12 kg/m.       Physical Examination:   General appearance: alert, well appearing, and in no distress  Psych: mood appropriate, normal affect  Skin: warm & dry   Cardiovascular: normal heart rate noted  Respiratory: normal respiratory effort, no distress  Abdomen: soft, non-tender   Pelvic: VULVA: normal appearing vulva with no masses, tenderness or lesions, VAGINA: normal appearing vagina with normal color and discharge, no lesions.  Small cystocele noted, stage II.  Good apical support no rectocele uterus and cervix surgically absent  Extremities: no edema   Chaperone:  Dr. Wyn Forster     Assessment & Plan:  1) stage 1.5 cystocele -Discussed pelvic organ prolapse, and reviewed findings suggestive of stage I-II cystocele -Reviewed management options including conservative monitoring, pessary or surgical intervention -Since she is currently asymptomatic advised patient to consider options and let us know should she note any further worsening of symptoms -If desired may consider pessary in the future   Return in about 1 year (around 01/27/2024), or if symptoms worsen or fail to improve, for Annual.   Myna Hidalgo, DO Attending Obstetrician & Gynecologist, [facility] for [facility], [facility]

## 2023-03-31 ENCOUNTER — Ambulatory Visit
Admission: EM | Admit: 2023-03-31 | Discharge: 2023-03-31 | Disposition: A | Discharge status: Home / Self Care | Payer: Medicare Other | Attending: Family Medicine | Admitting: Family Medicine

## 2023-03-31 DIAGNOSIS — G4700 Insomnia, unspecified: Secondary | ICD-10-CM | POA: Diagnosis not present

## 2023-03-31 DIAGNOSIS — R030 Elevated blood-pressure reading, without diagnosis of hypertension: Secondary | ICD-10-CM | POA: Diagnosis not present

## 2023-03-31 DIAGNOSIS — I10 Essential (primary) hypertension: Secondary | ICD-10-CM

## 2023-03-31 MED ORDER — AMLODIPINE BESYLATE 2.5 MG PO TABS: 2.5000 mg | ORAL_TABLET | Freq: Every day | ORAL | 0 refills | Status: AC

## 2023-03-31 NOTE — ED Triage Notes (Signed)
Pt c/o high blood pressure with hx of hypertension, pt states her BP hs been running 160-190 systolic since yesterday, pt takes BP medications and says that it is usually under control denies any headache or dizziness.

## 2023-03-31 NOTE — Discharge Instructions (Signed)
I have added an very low dose of a new blood pressure medication to help get your blood pressures under control in the immediate.  Log your home readings and discontinue the medication if having any side effects or your blood pressure readings go too low at any time.  Follow-up as soon as possible with your primary care provider to recheck things.  It is important to be getting your rest, try to add melatonin or magnesium supplements to help improve your sleep quality and follow-up additionally with primary care about this issue

## 2023-03-31 NOTE — ED Provider Notes (Signed)
[facility]    CSN: [id number] Arrival date & time: 03/31/23  1613      History   Chief Complaint No chief complaint on file.   HPI Michaela Shaffer is a 77 y.o. female.   Patient presenting today with 1 week history of elevated blood pressure readings between 160-190 systolic, 60s to 19J diastolic.  She states she has been under a lot of stress lately with her husband's health conditions and has not been sleeping for the past 2 weeks or so, wonders if that is what is causing it.  She denies any chest pain, shortness of breath, dizziness, headaches, visual changes, mental status changes, new medications or dietary changes.  She is compliant with her current blood pressure reading and states that this regimen has always kept her readings under good control.    Past Medical History:  Diagnosis Date   Breast CA (HCC) 2006   s/p chemo,rad Dr. Mariel Sleet   Constipation    Diabetes mellitus    Diverticula of colon    L side   Fecal incontinence    Hyperlipidemia    Hypertension    Infiltrating ductal carcinoma of breast (HCC) 04/25/2009   Qualifier: History of  By: Diana Eves     S/P colonoscopy Dec 2005, Nov 2010   2005: normal, 2010: lax sphincter tone, left-sided diverticula     Patient Active Problem List   Diagnosis Date Noted   Constipation 12/08/2018   Encounter for screening colonoscopy 12/08/2018   Disorder of thyroid gland 04/27/2015   Diarrhea 01/22/2011   HEMATOCHEZIA 04/26/2009   Infiltrating ductal carcinoma of breast (HCC) 04/25/2009   DM 04/25/2009   HYPERLIPIDEMIA 04/25/2009   Essential hypertension 04/25/2009   Incontinence of feces 04/25/2009   CONSTIPATION, HX OF 04/25/2009    Past Surgical History:  Procedure Laterality Date   BREAST BIOPSY Right 2014   COLONOSCOPY  05/16/2004   Dr.Rourk- normal rectum, normal colon.   COLONOSCOPY  05/02/2009   Dr.Rourk- lax sphincter tone, o/w normal rectum, few scattered L sided diverticula    COLONOSCOPY N/A 02/02/2019   Diverticulosis in sigmoid and descending colon, one 4 mm polyp in cecum. Tubular adenoma. No surveillance due to age.    PARTIAL HYSTERECTOMY     POLYPECTOMY  02/02/2019   Procedure: POLYPECTOMY;  Surgeon: Corbin Ade, MD;  Location: [facility];  Service: Endoscopy;;   portacath placement and removal     right lumpectomy      OB History     Gravida  3   Para  2   Term  2   Preterm      AB  1   Living  2      SAB  1   IAB      Ectopic      Multiple      Live Births  2            Home Medications    Prior to Admission medications   Medication Sig Start Date End Date Taking? Authorizing Provider  amLODipine (NORVASC) 2.5 MG tablet Take 1 tablet (2.5 mg total) by mouth daily. 03/31/23  Yes Particia Nearing, PA-C  aspirin 81 MG tablet Take 81 mg by mouth daily.    [provider]  Calcium Carbonate-Vitamin D 600-200 MG-UNIT TABS Take 1 tablet by mouth daily.    [provider]  hydrocortisone cream 0.5 % Apply topically 3 (three) times daily. 12/26/21   [provider]  metFORMIN (GLUCOPHAGE) 500 MG tablet Take 250 mg by mouth daily with breakfast.    [provider]  Misc. Devices MISC Please provide patient with mastectomy prothesis. 06/11/22   Doreatha Massed, MD  Misc. Devices MISC Please provide patient with mastectomy bras. 06/11/22   Doreatha Massed, MD  simvastatin (ZOCOR) 20 MG tablet Take 20 mg by mouth at bedtime.     [provider]  valsartan (DIOVAN) 320 MG tablet Take 320 mg by mouth daily.    [provider]  verapamil (CALAN-SR) 240 MG CR tablet Take 240 mg by mouth at bedtime.    [provider]    Family History Family History  Problem Relation Age of Onset   Stroke Father    Diabetes Mother    Diabetes Brother    Diabetes Brother    Cancer Sister    Diabetes Daughter    Colon cancer Neg Hx    Colon polyps Neg Hx     Social  History Social History   Tobacco Use   Smoking status: Never    Passive exposure: Never   Smokeless tobacco: Never  Vaping Use   Vaping status: Never Used  Substance Use Topics   Alcohol use: No   Drug use: No     Allergies   Penicillins   Review of Systems Review of Systems PER HPI  Physical Exam Triage Vital Signs ED Triage Vitals  Encounter Vitals Group     BP 03/31/23 1632 (!) 186/75     Systolic BP Percentile --      Diastolic BP Percentile --      Pulse Rate 03/31/23 1632 88     Resp 03/31/23 1632 16     Temp 03/31/23 1632 98.2 F (36.8 C)     Temp Source 03/31/23 1632 Oral     SpO2 --      Weight --      Height --      Head Circumference --      Peak Flow --      Pain Score 03/31/23 1633 0     Pain Loc --      Pain Education --      Exclude from Growth Chart --    No data found.  Updated Vital Signs BP (!) 186/75 (BP Location: Right Arm)   Pulse 88   Temp 98.2 F (36.8 C) (Oral)   Resp 16   Visual Acuity Right Eye Distance:   Left Eye Distance:   Bilateral Distance:    Right Eye Near:   Left Eye Near:    Bilateral Near:     Physical Exam Vitals and nursing note reviewed.  Constitutional:      Appearance: Normal appearance. She is not ill-appearing.  HENT:     Head: Atraumatic.     Mouth/Throat:     Mouth: Mucous membranes are moist.  Eyes:     Extraocular Movements: Extraocular movements intact.     Conjunctiva/sclera: Conjunctivae normal.  Cardiovascular:     Rate and Rhythm: Normal rate and regular rhythm.     Heart sounds: Normal heart sounds.  Pulmonary:     Effort: Pulmonary effort is normal.     Breath sounds: Normal breath sounds.  Musculoskeletal:        General: Normal range of motion.     Cervical back: Normal range of motion and neck supple.  Skin:    General: Skin is warm and dry.  Neurological:  Mental Status: She is alert and oriented to person, place, and time.     Motor: No weakness.     Gait: Gait  normal.  Psychiatric:        Mood and Affect: Mood normal.        Thought Content: Thought content normal.        Judgment: Judgment normal.      UC Treatments / Results  Labs (all labs ordered are listed, but only abnormal results are displayed) Labs Reviewed - No data to display  EKG   Radiology No results found.  Procedures Procedures (including critical care time)  Medications Ordered in UC Medications - No data to display  Initial Impression / Assessment and Plan / UC Course  I have reviewed the triage vital signs and the nursing notes.  Pertinent labs & imaging results that were available during my care of the patient were reviewed by me and considered in my medical decision making (see chart for details).     Discussed safe sleep recommendations, good sleep hygiene and will add a very low-dose of amlodipine to current blood pressure regimen to help lower blood pressures in the immediate until she can get in with her primary care provider for a recheck and further recommendations.  Discussed to continue logging home readings and to discontinue if having any side effects or low readings.  Final Clinical Impressions(s) / UC Diagnoses   Final diagnoses:  Elevated blood pressure reading  Essential hypertension  Insomnia, unspecified type     Discharge Instructions      I have added an very low dose of a new blood pressure medication to help get your blood pressures under control in the immediate.  Log your home readings and discontinue the medication if having any side effects or your blood pressure readings go too low at any time.  Follow-up as soon as possible with your primary care provider to recheck things.  It is important to be getting your rest, try to add melatonin or magnesium supplements to help improve your sleep quality and follow-up additionally with primary care about this issue    ED Prescriptions     Medication Sig Dispense Auth. Provider    amLODipine (NORVASC) 2.5 MG tablet Take 1 tablet (2.5 mg total) by mouth daily. 30 tablet Particia Nearing, [place]      PDMP not reviewed this encounter.   Particia Nearing, [place] 03/31/23 1656

## 2023-04-23 ENCOUNTER — Other Ambulatory Visit: Payer: Self-pay | Admitting: Family Medicine

## 2023-06-08 ENCOUNTER — Inpatient Hospital Stay: Payer: Medicare Other | Attending: Hematology

## 2023-06-08 ENCOUNTER — Ambulatory Visit (HOSPITAL_COMMUNITY)
Admission: RE | Admit: 2023-06-08 | Discharge: 2023-06-08 | Disposition: A | Discharge status: Home / Self Care | Payer: Medicare Other | Source: Ambulatory Visit | Attending: Hematology | Admitting: Hematology

## 2023-06-08 DIAGNOSIS — Z1231 Encounter for screening mammogram for malignant neoplasm of breast: Secondary | ICD-10-CM | POA: Diagnosis present

## 2023-06-08 DIAGNOSIS — E538 Deficiency of other specified B group vitamins: Secondary | ICD-10-CM | POA: Insufficient documentation

## 2023-06-08 DIAGNOSIS — M8580 Other specified disorders of bone density and structure, unspecified site: Secondary | ICD-10-CM | POA: Insufficient documentation

## 2023-06-08 DIAGNOSIS — Z853 Personal history of malignant neoplasm of breast: Secondary | ICD-10-CM | POA: Diagnosis not present

## 2023-06-08 DIAGNOSIS — C50911 Malignant neoplasm of unspecified site of right female breast: Secondary | ICD-10-CM | POA: Diagnosis present

## 2023-06-08 LAB — CBC WITH DIFFERENTIAL/PLATELET
Abs Immature Granulocytes: 0.01 10*3/uL (ref 0.00–0.07)
Basophils Absolute: 0 10*3/uL (ref 0.0–0.1)
Basophils Relative: 1 %
Eosinophils Absolute: 0.1 10*3/uL (ref 0.0–0.5)
Eosinophils Relative: 3 %
HCT: 36.4 % (ref 36.0–46.0)
Hemoglobin: 11.7 g/dL — ABNORMAL LOW (ref 12.0–15.0)
Immature Granulocytes: 0 %
Lymphocytes Relative: 43 %
Lymphs Abs: 1.8 10*3/uL (ref 0.7–4.0)
MCH: 26.7 pg (ref 26.0–34.0)
MCHC: 32.1 g/dL (ref 30.0–36.0)
MCV: 83.1 fL (ref 80.0–100.0)
Monocytes Absolute: 0.4 10*3/uL (ref 0.1–1.0)
Monocytes Relative: 10 %
Neutro Abs: 1.8 10*3/uL (ref 1.7–7.7)
Neutrophils Relative %: 43 %
Platelets: 206 10*3/uL (ref 150–400)
RBC: 4.38 MIL/uL (ref 3.87–5.11)
RDW: 13.8 % (ref 11.5–15.5)
WBC: 4.2 10*3/uL (ref 4.0–10.5)
nRBC: 0 % (ref 0.0–0.2)

## 2023-06-08 LAB — COMPREHENSIVE METABOLIC PANEL
ALT: 21 U/L (ref 0–44)
AST: 20 U/L (ref 15–41)
Albumin: 3.9 g/dL (ref 3.5–5.0)
Alkaline Phosphatase: 53 U/L (ref 38–126)
Anion gap: 6 (ref 5–15)
BUN: 20 mg/dL (ref 8–23)
CO2: 24 mmol/L (ref 22–32)
Calcium: 8.9 mg/dL (ref 8.9–10.3)
Chloride: 108 mmol/L (ref 98–111)
Creatinine, Ser: 0.98 mg/dL (ref 0.44–1.00)
GFR, Estimated: 59 mL/min — ABNORMAL LOW (ref >60)
Glucose, Bld: 88 mg/dL (ref 70–99)
Potassium: 4 mmol/L (ref 3.5–5.1)
Sodium: 138 mmol/L (ref 135–145)
Total Bilirubin: 0.6 mg/dL (ref <1.2)
Total Protein: 6.6 g/dL (ref 6.5–8.1)

## 2023-06-08 LAB — VITAMIN B12: Vitamin B-12: 152 pg/mL — ABNORMAL LOW (ref 180–914)

## 2023-06-15 ENCOUNTER — Inpatient Hospital Stay (HOSPITAL_BASED_OUTPATIENT_CLINIC_OR_DEPARTMENT_OTHER): Payer: Medicare Other | Admitting: Hematology

## 2023-06-15 VITALS — BP 172/76 | HR 85 | Temp 97.5°F | Resp 16 | Wt 160.4 lb

## 2023-06-15 DIAGNOSIS — C50911 Malignant neoplasm of unspecified site of right female breast: Secondary | ICD-10-CM

## 2023-06-15 DIAGNOSIS — E538 Deficiency of other specified B group vitamins: Secondary | ICD-10-CM | POA: Diagnosis not present

## 2023-06-15 NOTE — Patient Instructions (Addendum)
[facility] at [facility] Discharge Instructions   You were seen and examined today by Dr. Ellin Saba.  He reviewed the results of your lab work which are mostly normal. Your B12 was low this time. Dr. Kirtland Bouchard recommends you restart B12 tablets at least 3 times a week, for example on Mondays, Wednesdays, and Fridays.   He reviewed the results of your mammogram which did not show any evidence of cancer.   You no longer have to follow-up in our clinic for your history of breast cancer. You can follow with your primary care doctor. Just make sure you continue yearly mammograms.   Return as needed.    Thank you for choosing [facility] at [facility] to provide your oncology and hematology care.  To afford each patient quality time with our provider, please arrive at least 15 minutes before your scheduled appointment time.   If you have a lab appointment with the Cancer Center please come in thru the Main Entrance and check in at the main information desk.  You need to re-schedule your appointment should you arrive 10 or more minutes late.  We strive to give you quality time with our providers, and arriving late affects you and other patients whose appointments are after yours.  Also, if you no show three or more times for appointments you may be dismissed from the clinic at the providers discretion.     Again, thank you for choosing [facility].  Our hope is that these requests will decrease the amount of time that you wait before being seen by our physicians.       _____________________________________________________________  Should you have questions after your visit to [facility], please contact our office at [phone] and follow the prompts.  Our office hours are 8:00 a.m. and 4:30 p.m. Monday - Friday.  Please note that voicemails left after 4:00 p.m. may not be returned until the following business day.  We are closed  weekends and major holidays.  You do have access to a nurse 24-7, just call the main number to the clinic [phone] and do not press any options, hold on the line and a nurse will answer the phone.    For prescription refill requests, have your pharmacy contact our office and allow 72 hours.    Due to Covid, you will need to wear a mask upon entering the hospital. If you do not have a mask, a mask will be given to you at the Main Entrance upon arrival. For doctor visits, patients may have 1 support person age 51 or older with them. For treatment visits, patients can not have anyone with them due to social distancing guidelines and our immunocompromised population.

## 2023-06-15 NOTE — Progress Notes (Signed)
[facility] [address], [place] [address]   Patient Care Team: Assunta Found, MD as PCP - General (Family Medicine) Jena Gauss Gerrit Friends, MD (Gastroenterology)  SUMMARY OF ONCOLOGIC HISTORY: Oncology History  Infiltrating ductal carcinoma of breast (HCC)  02/13/2005 Initial Diagnosis   Infiltrating ductal carcinoma of breast   03/10/2005 Surgery   Right lumpectomy demonstrating a 1.6 cm invasive ductal carcinoma involving the deep margin with 0/1 lymph nodes for disease.  ER 0%, PR 0%, Her2 negative, Ki-67 38%   03/19/2005 Surgery   Re-excision of deep margin showing no residula cancer   04/15/2005 - 06/19/2005 Chemotherapy   FEC x 4 cycles   07/10/2005 - 08/21/2005 Chemotherapy   Navelbine/Taxotere x 4 cycles   09/22/2005 - 11/06/2005 Radiation Therapy   Dr. Roselind Messier   09/13/2015 Imaging   CT chest performed by PCP for cough- Negative CT scan of the chest.     CHIEF COMPLIANT: Follow-up for right breast cancer and B12 deficiency   INTERVAL HISTORY: [name] is a 77 y.o. female seen for follow-up of right breast cancer and B12 deficiency.  She has not been taking B12 supplements for more than 2 years.  Reports appetite and energy levels at 100%.  Denies any new onset pains.  REVIEW OF SYSTEMS:   Review of Systems  Cardiovascular:  Positive for palpitations.  All other systems reviewed and are negative.   I have reviewed the past medical history, past surgical history, social history and family history with the patient and they are unchanged from previous note.   ALLERGIES:   is allergic to penicillins.   MEDICATIONS:  Current Outpatient Medications  Medication Sig Dispense Refill   amLODipine (NORVASC) 2.5 MG tablet Take 1 tablet (2.5 mg total) by mouth daily. 30 tablet 0   aspirin 81 MG tablet Take 81 mg by mouth daily.     Calcium Carbonate-Vitamin D 600-200 MG-UNIT TABS Take 1 tablet by mouth daily.     hydrocortisone cream 0.5 % Apply  topically 3 (three) times daily.     metFORMIN (GLUCOPHAGE) 500 MG tablet Take 250 mg by mouth daily with breakfast.     Misc. Devices MISC Please provide patient with mastectomy prothesis. 2 each 0   Misc. Devices MISC Please provide patient with mastectomy bras. 7 each 0   simvastatin (ZOCOR) 20 MG tablet Take 20 mg by mouth at bedtime.      valsartan (DIOVAN) 320 MG tablet Take 320 mg by mouth daily.     verapamil (CALAN-SR) 240 MG CR tablet Take 240 mg by mouth at bedtime.     No current facility-administered medications for this visit.     PHYSICAL EXAMINATION: Performance status (ECOG): 1 - Symptomatic but completely ambulatory  There were no vitals filed for this visit.  Wt Readings from Last 3 Encounters:  01/27/23 154 lb (69.9 kg)  06/11/22 159 lb 11.2 oz (72.4 kg)  11/20/21 156 lb 11.2 oz (71.1 kg)   Physical Exam Vitals reviewed.  Constitutional:      Appearance: Normal appearance.  Cardiovascular:     Rate and Rhythm: Normal rate and regular rhythm.     Pulses: Normal pulses.     Heart sounds: Normal heart sounds.  Pulmonary:     Effort: Pulmonary effort is normal.     Breath sounds: Normal breath sounds.  Chest:  Breasts:    Right: Normal. No swelling, bleeding, inverted nipple, mass, nipple discharge, skin change or tenderness.  Left: Normal. No swelling, bleeding, inverted nipple, mass, nipple discharge, skin change (lumpectomy scar in medial quadrant unchanged) or tenderness.  Neurological:     General: No focal deficit present.     Mental Status: She is alert and oriented to person, place, and time.  Psychiatric:        Mood and Affect: Mood normal.        Behavior: Behavior normal.     Breast Exam Chaperone: Chasity Edwards   LABORATORY DATA:  I have reviewed the data as listed    Latest Ref Rng & Units 06/08/2023    2:58 PM 06/03/2022    9:14 AM 05/31/2021    9:54 AM  CMP  Glucose 70 - 99 mg/dL 88  086  92   BUN 8 - 23 mg/dL 20  18  21     Creatinine 0.44 - 1.00 mg/dL 5.78  4.69  6.29   Sodium 135 - 145 mmol/L 138  139  138   Potassium 3.5 - 5.1 mmol/L 4.0  4.0  4.2   Chloride 98 - 111 mmol/L 108  109  107   CO2 22 - 32 mmol/L 24  23  22    Calcium 8.9 - 10.3 mg/dL 8.9  9.1  9.5   Total Protein 6.5 - 8.1 g/dL 6.6  6.9  7.2   Total Bilirubin <1.2 mg/dL 0.6  0.7  0.5   Alkaline Phos 38 - 126 U/L 53  53  57   AST 15 - 41 U/L 20  20  21    ALT 0 - 44 U/L 21  22  26     No results found for: "CAN153" Lab Results  Component Value Date   WBC 4.2 06/08/2023   HGB 11.7 (L) 06/08/2023   HCT 36.4 06/08/2023   MCV 83.1 06/08/2023   PLT 206 06/08/2023   NEUTROABS 1.8 06/08/2023    ASSESSMENT:  1.  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 -Tumor was 1.6 cm, grade 3, 1 - sentinel lymph node.  No lymphovascular invasion. -FEC x4 cycles followed by dose dense Taxotere and Navelbine for 4 cycles completed on 08/21/2005. -XRT of the right breast completed.      PLAN:  1.  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  No palpable adenopathy. - Mammogram on 06/08/2023 was BI-RADS Category 1. - Labs from 06/08/2023: Normal LFTs.  CBC is normal. - She was recommended that she have mammogram every year.  She may follow-up with Dr. Phillips Odor.  I will be glad to see her on an as-needed basis.   2.  Vitamin B12 deficiency: - She stopped taking B12 supplements for more than 2 years.  B12 level is low at 152. - Recommend restarting B12 and take it 3 times weekly, Monday Wednesday and Friday.   Breast Cancer therapy associated bone loss: I have recommended calcium, Vitamin D and weight bearing exercises.  Orders placed this encounter:  No orders of the defined types were placed in this encounter.   The patient has a good understanding of the overall  plan. She agrees with it. She will call with any problems that may develop before the next visit here.  Doreatha Massed, MD [facility] [phone]

## 2024-02-05 ENCOUNTER — Other Ambulatory Visit: Payer: Self-pay

## 2024-02-05 ENCOUNTER — Encounter (HOSPITAL_COMMUNITY): Payer: Self-pay

## 2024-02-05 ENCOUNTER — Emergency Department (HOSPITAL_COMMUNITY)
Admission: EM | Admit: 2024-02-05 | Discharge: 2024-02-05 | Disposition: A | Discharge status: Home / Self Care | Attending: Emergency Medicine | Admitting: Emergency Medicine

## 2024-02-05 DIAGNOSIS — Z23 Encounter for immunization: Secondary | ICD-10-CM | POA: Diagnosis not present

## 2024-02-05 DIAGNOSIS — S61412A Laceration without foreign body of left hand, initial encounter: Secondary | ICD-10-CM | POA: Diagnosis present

## 2024-02-05 DIAGNOSIS — E119 Type 2 diabetes mellitus without complications: Secondary | ICD-10-CM | POA: Insufficient documentation

## 2024-02-05 DIAGNOSIS — W268XXA Contact with other sharp object(s), not elsewhere classified, initial encounter: Secondary | ICD-10-CM | POA: Insufficient documentation

## 2024-02-05 DIAGNOSIS — Z7982 Long term (current) use of aspirin: Secondary | ICD-10-CM | POA: Insufficient documentation

## 2024-02-05 DIAGNOSIS — Z853 Personal history of malignant neoplasm of breast: Secondary | ICD-10-CM | POA: Diagnosis not present

## 2024-02-05 MED ORDER — LIDOCAINE-EPINEPHRINE (PF) 2 %-1:200000 IJ SOLN
10.0000 mL | Freq: Once | INTRAMUSCULAR | Status: AC
  Administered 2024-02-05: 10 mL
  Filled 2024-02-05: qty 20

## 2024-02-05 MED ORDER — TETANUS-DIPHTH-ACELL PERTUSSIS 5-2.5-18.5 LF-MCG/0.5 IM SUSY
0.5000 mL | PREFILLED_SYRINGE | Freq: Once | INTRAMUSCULAR | Status: AC
  Administered 2024-02-05: 0.5 mL via INTRAMUSCULAR
  Filled 2024-02-05: qty 0.5

## 2024-02-05 NOTE — ED Provider Notes (Signed)
 [place] EMERGENCY DEPARTMENT AT [facility] Provider Note   CSN: [id number] Arrival date & time: 02/05/24  1701     Patient presents with: Laceration   Michaela Shaffer is a 78 y.o. female.  Patient with past medical history of breast cancer, hyperlipidemia, diabetes is presented to emergency room with complaint of laceration.  Patient reports that approximately 3 PM today she had her left hand against a metal tin while she was doing some cleaning.  She has tried to control bleeding at home but it continues to ooze whenever she is moving her left hand.  She is unsure of her last tetanus shot.  She denies numbness or tingling.  She is able to move her hand with normal range of motion.    Laceration      Prior to Admission medications   Medication Sig Start Date End Date Taking? Authorizing Provider  amLODipine  (NORVASC ) 2.5 MG tablet Take 1 tablet (2.5 mg total) by mouth daily. 03/31/23   Stuart Vernell Norris, PA-C  aspirin 81 MG tablet Take 81 mg by mouth daily.    [provider]  azithromycin (ZITHROMAX) 250 MG tablet Take by mouth. 06/12/23   [provider]  Calcium Carbonate-Vitamin D  600-200 MG-UNIT TABS Take 1 tablet by mouth daily.    [provider]  hydrocortisone cream 0.5 % Apply topically 3 (three) times daily. 12/26/21   [provider]  metFORMIN (GLUCOPHAGE) 500 MG tablet Take 250 mg by mouth daily with breakfast.    [provider]  Misc. Devices MISC Please provide patient with mastectomy prothesis. 06/11/22   Rogers Hai, MD  Misc. Devices MISC Please provide patient with mastectomy bras. 06/11/22   Katragadda, Sreedhar, MD  simvastatin (ZOCOR) 20 MG tablet Take 20 mg by mouth at bedtime.     [provider]  valsartan (DIOVAN) 320 MG tablet Take 320 mg by mouth daily.    [provider]  verapamil (CALAN-SR) 240 MG CR tablet Take 240 mg by mouth at bedtime.    [provider]     Allergies: Penicillins    Review of Systems  Skin:  Positive for wound.    Updated Vital Signs BP (!) 176/78 (BP Location: Left Arm)   Pulse 92   Temp 98 F (36.7 C) (Oral)   Resp 17   SpO2 99%   Physical Exam Vitals and nursing note reviewed.  Constitutional:      General: She is not in acute distress.    Appearance: She is not toxic-appearing.  HENT:     Head: Normocephalic and atraumatic.  Eyes:     General: No scleral icterus.    Conjunctiva/sclera: Conjunctivae normal.  Cardiovascular:     Rate and Rhythm: Normal rate and regular rhythm.     Pulses: Normal pulses.     Heart sounds: Normal heart sounds.  Pulmonary:     Effort: Pulmonary effort is normal. No respiratory distress.     Breath sounds: Normal breath sounds.  Abdominal:     General: Abdomen is flat. Bowel sounds are normal.     Palpations: Abdomen is soft.     Tenderness: There is no abdominal tenderness.  Musculoskeletal:     Comments: Approximately 3 cm laceration to the dorsal left hand.  Neurovascularly intact.  Able to range finger through full range of motion.  Skin:    General: Skin is warm and dry.     Findings: No lesion.  Neurological:  General: No focal deficit present.     Mental Status: She is alert and oriented to person, place, and time. Mental status is at baseline.     (all labs ordered are listed, but only abnormal results are displayed) Labs Reviewed - No data to display  EKG: None  Radiology: No results found.   .Laceration Repair  Date/Time: 02/05/2024 7:22 PM  Performed by: Shermon Warren SAILOR, PA-C Authorized by: Shermon Warren SAILOR, PA-C   Consent:    Consent obtained:  Verbal   Consent given by:  Patient   Risks, benefits, and alternatives were discussed: yes     Risks discussed:  Infection, need for additional repair, nerve damage, poor wound healing, poor cosmetic result, pain, retained foreign body, tendon damage and vascular damage   Alternatives  discussed:  Referral, observation, delayed treatment and no treatment Universal protocol:    Procedure explained and questions answered to patient or proxy's satisfaction: yes     Relevant documents present and verified: yes     Test results available: yes     Imaging studies available: yes     Required blood products, implants, devices, and special equipment available: yes     Site/side marked: yes     Immediately prior to procedure, a time out was called: yes     Patient identity confirmed:  Verbally with patient Anesthesia:    Anesthesia method:  Local infiltration   Local anesthetic:  Lidocaine  2% WITH epi Laceration details:    Location:  Hand   Hand location:  L hand, dorsum   Length (cm):  3 Pre-procedure details:    Preparation:  Patient was prepped and draped in usual sterile fashion Exploration:    Hemostasis achieved with:  Epinephrine    Wound extent: no foreign body, no signs of injury, no tendon damage and no vascular damage     Contaminated: no   Treatment:    Area cleansed with:  Povidone-iodine   Amount of cleaning:  Standard   Irrigation solution:  Sterile saline   Irrigation method:  Pressure wash Skin repair:    Repair method:  Sutures   Suture size:  4-0   Suture material:  Nylon   Suture technique:  Simple interrupted   Number of sutures:  5 Approximation:    Approximation:  Close Repair type:    Repair type:  Simple Post-procedure details:    Dressing:  Antibiotic ointment   Procedure completion:  Tolerated    Medications Ordered in the ED  lidocaine -EPINEPHrine  (XYLOCAINE  W/EPI) 2 %-1:200000 (PF) injection 10 mL (10 mLs Infiltration Given by Other 02/05/24 1827)  Tdap (BOOSTRIX ) injection 0.5 mL (0.5 mLs Intramuscular Given 02/05/24 1826)                                    Medical Decision Making Risk Prescription drug management.   Michaela Shaffer 78 y.o. presented today for a 3 cm laceration to their dorsal left hand. Working Ddx: FB,  fracture, NV compromise, simple laceration.  R/o DDx: These ddx are considered less likely due to history of present illness and physical exam findings.  Patient presented for a 3 cm laceration to their left hand. They are neurovascularly intact. Tetanus is given here. Patient is in no distress. Laceration will be repaired with standard wound care procedures and antibiotic ointment.    Problem List / ED Course / Critical interventions / Medication management  Patient presented to emergency room with complaint of dorsal laceration to left hand.  Area was cleaned extensively.  Tetanus shot was up-to-date.  Procedure done without any complication.  Able to view entire aspect of wound with no foreign body. She has neurovascularly intact.  Plan: F/u for suture removal in 10 days.  Patient is stable for discharge at this time. Patient/family expressed understanding of return precautions and need for follow-up.  Keep wound clean, covered. Educated on sx of concern regarding complications -- such as infection, poor wound healing, compartment sx.       Final diagnoses:  Laceration of left hand without foreign body, initial encounter    ED Discharge Orders     None          Shermon Warren LOISE RIGGERS 02/05/24 CLEOTIS Suzette Pac, MD 02/08/24 [phone]

## 2024-02-05 NOTE — Discharge Instructions (Signed)
 I would recommend keeping area clean dry and covered.  You can apply Neosporin or bacitracin to the wound 1-2 times daily with dressing changes. Follow-up for suture removal in 10 days with your primary care. Return to emergency room with new or worsening symptoms like fever or drainage from the site.

## 2024-02-05 NOTE — ED Triage Notes (Signed)
 Pt stated that she hit her hand on some metal in her garage while she was cleaning

## 2024-02-16 ENCOUNTER — Other Ambulatory Visit: Payer: Self-pay

## 2024-02-16 ENCOUNTER — Encounter (HOSPITAL_COMMUNITY): Payer: Self-pay

## 2024-02-16 ENCOUNTER — Emergency Department (HOSPITAL_COMMUNITY): Admission: EM | Admit: 2024-02-16 | Discharge: 2024-02-16 | Disposition: A | Discharge status: Home / Self Care

## 2024-02-16 DIAGNOSIS — Z7984 Long term (current) use of oral hypoglycemic drugs: Secondary | ICD-10-CM | POA: Insufficient documentation

## 2024-02-16 DIAGNOSIS — Z7982 Long term (current) use of aspirin: Secondary | ICD-10-CM | POA: Insufficient documentation

## 2024-02-16 DIAGNOSIS — Z4802 Encounter for removal of sutures: Secondary | ICD-10-CM | POA: Insufficient documentation

## 2024-02-16 DIAGNOSIS — Z79899 Other long term (current) drug therapy: Secondary | ICD-10-CM | POA: Diagnosis not present

## 2024-02-16 NOTE — ED Triage Notes (Signed)
 Pt arrived via POV from home for suture removal from her left hand.

## 2024-02-16 NOTE — ED Provider Notes (Signed)
  EMERGENCY DEPARTMENT AT [facility] Provider Note   CSN: [id number] Arrival date & time: 02/16/24  1607     Patient presents with: Suture / Staple Removal   Michaela Shaffer is a 78 y.o. female.    Suture / Staple Removal Pertinent negatives include no chest pain.       Michaela Shaffer is a 78 y.o. female who presents to the Emergency Department here requesting suture removal.  She was seen here 11 days ago for laceration to her left hand.  She states the area was closed with sutures.  She denies any swelling numbness or pain of her hand.  She states the area has been healing well.  Prior to Admission medications   Medication Sig Start Date End Date Taking? Authorizing Provider  amLODipine  (NORVASC ) 2.5 MG tablet Take 1 tablet (2.5 mg total) by mouth daily. 03/31/23   Stuart Vernell Norris, PA-C  aspirin 81 MG tablet Take 81 mg by mouth daily.    [provider]  azithromycin (ZITHROMAX) 250 MG tablet Take by mouth. 06/12/23   [provider]  Calcium Carbonate-Vitamin D  600-200 MG-UNIT TABS Take 1 tablet by mouth daily.    [provider]  hydrocortisone cream 0.5 % Apply topically 3 (three) times daily. 12/26/21   [provider]  metFORMIN (GLUCOPHAGE) 500 MG tablet Take 250 mg by mouth daily with breakfast.    [provider]  Misc. Devices MISC Please provide patient with mastectomy prothesis. 06/11/22   Rogers Hai, MD  Misc. Devices MISC Please provide patient with mastectomy bras. 06/11/22   Katragadda, Sreedhar, MD  simvastatin (ZOCOR) 20 MG tablet Take 20 mg by mouth at bedtime.     [provider]  valsartan (DIOVAN) 320 MG tablet Take 320 mg by mouth daily.    [provider]  verapamil (CALAN-SR) 240 MG CR tablet Take 240 mg by mouth at bedtime.    [provider]    Allergies: Penicillins    Review of Systems  Constitutional:  Negative for chills and fever.   Cardiovascular:  Negative for chest pain.  Musculoskeletal:  Negative for arthralgias and myalgias.  Skin:  Positive for wound (Laceration dorsal left hand).  Neurological:  Negative for weakness.    Updated Vital Signs BP (!) 161/80 (BP Location: Left Arm)   Pulse 97   Temp 97.8 F (36.6 C) (Oral)   Resp 18   Ht 5' 7 (1.702 m)   Wt 72.8 kg   SpO2 98%   BMI 25.14 kg/m   Physical Exam Vitals and nursing note reviewed.  Constitutional:      General: She is not in acute distress.    Appearance: Normal appearance.  Cardiovascular:     Rate and Rhythm: Normal rate and regular rhythm.     Pulses: Normal pulses.  Pulmonary:     Effort: Pulmonary effort is normal.  Musculoskeletal:        General: No swelling or tenderness.     Comments: Laceration dorsal left hand with sutures intact.  No significant erythema, no edema or lymphangitis.  Patient has full range of motion of the fingers of the left hand.  Normal finger thumb opposition.  Wrist is nontender  Skin:    General: Skin is warm.     Capillary Refill: Capillary refill takes less than 2 seconds.  Neurological:     General: No focal deficit present.     Mental Status: She is alert.  Sensory: No sensory deficit.     Motor: No weakness.     (all labs ordered are listed, but only abnormal results are displayed) Labs Reviewed - No data to display  EKG: None  Radiology: No results found.   Procedures   Medications Ordered in the ED - No data to display                                  Medical Decision Making Patient here for suture removal.  Denies any symptoms presently.  No clinical findings suggestive of wound dehiscence or infection.  Extremity is neurovascularly intact.  Wound appears to be healing well.  Laceration is located near the metacarpal head  Amount and/or Complexity of Data Reviewed Discussion of management or test interpretation with external provider(s): Sutures removed by nursing staff  without difficulty.  Wound care instructions were given Steri-Strip was applied for continued support of the wound.  Because of the location of the wound, patient given wound care instructions to avoid excessive use of her left hand until the wound heals further.        Final diagnoses:  Visit for suture removal    ED Discharge Orders     None          Herlinda Milling, PA-C 02/16/24 1654    Simon Lavonia SAILOR, MD 02/16/24 [phone]

## 2024-02-16 NOTE — Discharge Instructions (Signed)
 Your laceration appears to be healing well.  As discussed, keep the wound clean and dry.  Try to avoid excessive use of your left hand or gripping for least another week as this can cause the wound to reopen.  Return to the emergency department if needed

## 2024-03-18 ENCOUNTER — Ambulatory Visit: Admitting: Obstetrics & Gynecology

## 2024-06-21 ENCOUNTER — Ambulatory Visit: Payer: Self-pay | Admitting: Family Medicine

## 2024-06-22 ENCOUNTER — Ambulatory Visit: Payer: Self-pay | Admitting: Family Medicine

## 2024-09-15 ENCOUNTER — Ambulatory Visit: Payer: Self-pay
